# Patient Record
Sex: Female | Born: 2000
Health system: Southern US, Community
[De-identification: ages and names within clinical notes are randomized; demographics above are authoritative.]

## PROBLEM LIST (undated history)

## (undated) DIAGNOSIS — D573 Sickle-cell trait: Secondary | ICD-10-CM

## (undated) HISTORY — PX: TONSILLECTOMY: SUR1361

---
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
## 2018-01-13 NOTE — L&D Delivery Note (Signed)
Delivery Note Pt progressed to complete and pushed well.  FHR continued with variable decels with ctx but she made rapid progress.  At 11:08 AM a viable female was delivered via Vaginal, Spontaneous (Presentation: vtx; LOA ).  APGAR: 9, 9; weight pending.   Placenta status: spontaneous, intact.  Cord:  with the following complications: none.  Anesthesia:  Local Episiotomy: None Lacerations:  1st degree vaginal and right labial Suture Repair: 3.0 vicryl rapide Est. Blood Loss (mL):  400  Mom to postpartum.  Baby to Couplet care / Skin to Skin.  Blane Ohara Shaqueta Casady 10/17/2018, 11:29 AM

## 2018-02-05 ENCOUNTER — Emergency Department (HOSPITAL_COMMUNITY): Payer: 59

## 2018-02-05 ENCOUNTER — Emergency Department (HOSPITAL_COMMUNITY)
Admission: EM | Admit: 2018-02-05 | Discharge: 2018-02-05 | Disposition: A | Discharge status: Home / Self Care | Payer: 59 | Attending: Emergency Medicine | Admitting: Emergency Medicine

## 2018-02-05 ENCOUNTER — Encounter (HOSPITAL_COMMUNITY): Payer: Self-pay | Admitting: Emergency Medicine

## 2018-02-05 DIAGNOSIS — X509XXA Other and unspecified overexertion or strenuous movements or postures, initial encounter: Secondary | ICD-10-CM | POA: Insufficient documentation

## 2018-02-05 DIAGNOSIS — F17210 Nicotine dependence, cigarettes, uncomplicated: Secondary | ICD-10-CM | POA: Insufficient documentation

## 2018-02-05 DIAGNOSIS — K5900 Constipation, unspecified: Secondary | ICD-10-CM | POA: Insufficient documentation

## 2018-02-05 DIAGNOSIS — Y939 Activity, unspecified: Secondary | ICD-10-CM | POA: Insufficient documentation

## 2018-02-05 DIAGNOSIS — Y929 Unspecified place or not applicable: Secondary | ICD-10-CM | POA: Diagnosis not present

## 2018-02-05 DIAGNOSIS — S39012A Strain of muscle, fascia and tendon of lower back, initial encounter: Secondary | ICD-10-CM | POA: Diagnosis not present

## 2018-02-05 DIAGNOSIS — S3992XA Unspecified injury of lower back, initial encounter: Secondary | ICD-10-CM | POA: Diagnosis present

## 2018-02-05 DIAGNOSIS — Y999 Unspecified external cause status: Secondary | ICD-10-CM | POA: Diagnosis not present

## 2018-02-05 LAB — URINALYSIS, ROUTINE W REFLEX MICROSCOPIC
BILIRUBIN URINE: NEGATIVE
Bacteria, UA: NONE SEEN
Glucose, UA: NEGATIVE mg/dL
Hgb urine dipstick: NEGATIVE
KETONES UR: NEGATIVE mg/dL
NITRITE: NEGATIVE
Protein, ur: NEGATIVE mg/dL
Specific Gravity, Urine: 1.011 (ref 1.005–1.030)
pH: 6 (ref 5.0–8.0)

## 2018-02-05 LAB — PREGNANCY, URINE: Preg Test, Ur: NEGATIVE

## 2018-02-05 MED ORDER — METHOCARBAMOL 500 MG PO TABS: 500.0000 mg | ORAL_TABLET | Freq: Two times a day (BID) | ORAL | 0 refills | Status: DC

## 2018-02-05 MED ORDER — POLYETHYLENE GLYCOL 3350 17 G PO PACK: 17.0000 g | PACK | Freq: Every day | ORAL | 0 refills | Status: DC

## 2018-02-05 MED ORDER — NAPROXEN 500 MG PO TABS: 500.0000 mg | ORAL_TABLET | Freq: Two times a day (BID) | ORAL | 0 refills | Status: DC

## 2018-02-05 NOTE — Discharge Instructions (Addendum)
Please read attached information regarding increasing fiber in your diet to help with your constipation. Take MiraLAX to help with constipation. Take naproxen and Robaxin to help with your back pain. Return to ED for worsening back pain, losing control of your bowels or bladder, numbness in your legs, fever or pain with urination.

## 2018-02-05 NOTE — ED Triage Notes (Signed)
Pt reports lower back pains and constipation. Thinks her last BM was week ago. Denies urinary or n/v.

## 2018-02-05 NOTE — ED Provider Notes (Signed)
[facility]-EMERGENCY DEPT Provider Note   CSN: [id number] Arrival date & time: 02/05/18  1448     History   Chief Complaint Chief Complaint  Patient presents with  . Back Pain  . Constipation    HPI Julie Randall is a 18 y.o. female who presents to ED for multiple complaints. Her first complaint is 2-day history of lower back pain.  Describes the pain as aching, radiates throughout her entire lower back.  No improvement noted with over-the-counter medications such as Tylenol and ibuprofen.  No history of similar symptoms in the past.  Denies any heavy lifting, injuries or falls.  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  Denies any urinary symptoms, fever, prior back surgeries, numbness in arms or legs, loss of bowel or bladder function. Her next complaint is constipation.  States that she last had a bowel movement 1 week ago.  States that she usually has a bowel movement every 3 to 4 days.  She has not tried any medicine for this.  Denies any abdominal pain, vomiting, nausea, prior abdominal surgeries.  HPI  History reviewed. No pertinent past medical history.  There are no active problems to display for this patient.   Past Surgical History:  Procedure Laterality Date  . TONSILLECTOMY       OB History    Gravida  0   Para  0   Term  0   Preterm  0   AB  0   Living  0     SAB  0   TAB  0   Ectopic  0   Multiple  0   Live Births  0            Home Medications    Prior to Admission medications   Medication Sig Start Date End Date Taking? Authorizing Provider  acetaminophen (TYLENOL) 325 MG tablet Take 650 mg by mouth every 6 (six) hours as needed for moderate pain.   Yes [provider]  fluticasone (FLONASE) 50 MCG/ACT nasal spray Place 1 spray into both nostrils daily. Patient not taking: Reported on 08/31/2017 05/12/17   Caccavale, Sophia, PA-C  methocarbamol (ROBAXIN) 500 MG tablet Take 1 tablet (500 mg  total) by mouth 2 (two) times daily. 02/05/18   Hriday Stai, PA-C  naproxen (NAPROSYN) 500 MG tablet Take 1 tablet (500 mg total) by mouth 2 (two) times daily. 02/05/18   Vicenta Olds, PA-C  omeprazole (PRILOSEC) 20 MG capsule Take 1 capsule (20 mg total) by mouth daily. Patient not taking: Reported on 05/12/2017 02/11/17   Horton, Mayer Masker, MD  polyethylene glycol (MIRALAX / GLYCOLAX) packet Take 17 g by mouth daily. 02/05/18   Dietrich Pates, PA-C    Family History Family History  Problem Relation Age of Onset  . Breast cancer Mother   . Breast cancer Maternal Grandmother     Social History Social History   Tobacco Use  . Smoking status: Current Some Day Smoker    Types: Cigars  . Smokeless tobacco: Never Used  Substance Use Topics  . Alcohol use: No    Frequency: Never  . Drug use: No     Allergies   Patient has no known allergies.   Review of Systems Review of Systems  Constitutional: Negative for appetite change, chills and fever.  HENT: Negative for ear pain, rhinorrhea, sneezing and sore throat.   Eyes: Negative for photophobia and visual disturbance.  Respiratory: Negative for cough, chest tightness,  shortness of breath and wheezing.   Cardiovascular: Negative for chest pain and palpitations.  Gastrointestinal: Positive for constipation. Negative for abdominal pain, blood in stool, diarrhea, nausea and vomiting.  Genitourinary: Negative for dysuria, hematuria and urgency.  Musculoskeletal: Positive for back pain. Negative for myalgias.  Skin: Negative for rash.  Neurological: Negative for dizziness, weakness and light-headedness.     Physical Exam Updated Vital Signs BP 125/74 (BP Location: Left Arm)   Pulse 83   Temp 98.5 F (36.9 C) (Oral)   Resp 17   LMP 01/24/2018   SpO2 99%   Physical Exam Vitals signs and nursing note reviewed.  Constitutional:      General: She is not in acute distress.    Appearance: She is well-developed.  HENT:     Head:  Normocephalic and atraumatic.     Nose: Nose normal.  Eyes:     General: No scleral icterus.       Right eye: No discharge.        Left eye: No discharge.     Conjunctiva/sclera: Conjunctivae normal.  Neck:     Musculoskeletal: Normal range of motion and neck supple.  Cardiovascular:     Rate and Rhythm: Normal rate and regular rhythm.     Heart sounds: Normal heart sounds. No murmur. No friction rub. No gallop.   Pulmonary:     Effort: Pulmonary effort is normal. No respiratory distress.     Breath sounds: Normal breath sounds.  Abdominal:     General: Bowel sounds are normal. There is no distension.     Palpations: Abdomen is soft.     Tenderness: There is no abdominal tenderness. There is no guarding.  Musculoskeletal: Normal range of motion.     Lumbar back: She exhibits tenderness.       Back:     Comments: No step-off palpated. No visible bruising, edema or temperature change noted. No objective signs of numbness present. No saddle anesthesia. 2+ DP pulses bilaterally. Sensation intact to light touch. Strength 5/5 in bilateral lower extremities.  Skin:    General: Skin is warm and dry.     Findings: No rash.  Neurological:     Mental Status: She is alert.     Motor: No abnormal muscle tone.     Coordination: Coordination normal.      ED Treatments / Results  Labs (all labs ordered are listed, but only abnormal results are displayed) Labs Reviewed  URINALYSIS, ROUTINE W REFLEX MICROSCOPIC - Abnormal; Notable for the following components:      Result Value   Leukocytes, UA TRACE (*)    All other components within normal limits  PREGNANCY, URINE    EKG None  Radiology Dg Abdomen 1 View  Result Date: 02/05/2018 CLINICAL DATA:  Low back pain for 2 days.  Constipation for a week. EXAM: ABDOMEN - 1 VIEW COMPARISON:  None. FINDINGS: The fecal burden is normal. The bowel gas pattern is normal. The bones and soft tissues are normal. IMPRESSION: Negative. Electronically  Signed   By: Gerome Samavid  Williams III M.D   On: 02/05/2018 16:52    Procedures Procedures (including critical care time)  Medications Ordered in ED Medications - No data to display   Initial Impression / Assessment and Plan / ED Course  I have reviewed the triage vital signs and the nursing notes.  Pertinent labs & imaging results that were available during my care of the patient were reviewed by me and considered in my  medical decision making (see chart for details).     Patient denies any concerning symptoms suggestive of cauda equina requiring urgent imaging at this time such as loss of sensation in the lower extremities, lower extremity weakness, loss of bowel or bladder control, saddle anesthesia, urinary retention, fever/chills, IVDU. Exam demonstrated no  weakness on exam today. No preceding injury or trauma to suggest acute fracture. Doubt pelvic or urinary pathology for patient's acute back pain, as patient denies urinary symptoms, has no evidence of infection on UA, has no CVA tenderness, history/pain not consistent with nephrolithiasis, has no vaginal discharge, and is not pregnant as evidenced by negative POC urine pregnancy test. Doubt AAA as cause of patient's back pain as patient lacks major risk factors, had no abdominal TTP, and has symmetric and intact distal pulses.  She is declining enema or rectal exam to assess for tone or disimpaction.  X-rays negative for obstruction.  She prefers p.o. medications to help with her symptoms.  Patient given strict return precautions for any symptoms indicating worsening neurologic function in the lower extremities.  Patient is hemodynamically stable, in NAD, and able to ambulate in the ED. Evaluation does not show pathology that would require ongoing emergent intervention or inpatient treatment. I explained the diagnosis to the patient. Pain has been managed and has no complaints prior to discharge. Patient is comfortable with above plan and is  stable for discharge at this time. All questions were answered prior to disposition. Strict return precautions for returning to the ED were discussed. Encouraged follow up with PCP.    Portions of this note were generated with Scientist, clinical (histocompatibility and immunogenetics). Dictation errors may occur despite best attempts at proofreading.   Final Clinical Impressions(s) / ED Diagnoses   Final diagnoses:  Constipation, unspecified constipation type  Strain of lumbar region, initial encounter    ED Discharge Orders         Ordered    polyethylene glycol (MIRALAX / GLYCOLAX) packet  Daily     02/05/18 1700    naproxen (NAPROSYN) 500 MG tablet  2 times daily     02/05/18 1700    methocarbamol (ROBAXIN) 500 MG tablet  2 times daily     02/05/18 1700           Dietrich Pates, PA-C 02/05/18 1702    Loren Racer, MD 02/06/18 1645

## 2018-04-04 ENCOUNTER — Emergency Department (HOSPITAL_COMMUNITY)
Admission: EM | Admit: 2018-04-04 | Discharge: 2018-04-04 | Disposition: A | Discharge status: Home / Self Care | Payer: 59 | Attending: Emergency Medicine | Admitting: Emergency Medicine

## 2018-04-04 ENCOUNTER — Encounter (HOSPITAL_COMMUNITY): Payer: Self-pay

## 2018-04-04 ENCOUNTER — Other Ambulatory Visit: Payer: Self-pay

## 2018-04-04 DIAGNOSIS — Z3A09 9 weeks gestation of pregnancy: Secondary | ICD-10-CM | POA: Insufficient documentation

## 2018-04-04 DIAGNOSIS — Z79899 Other long term (current) drug therapy: Secondary | ICD-10-CM | POA: Diagnosis not present

## 2018-04-04 DIAGNOSIS — M545 Low back pain: Secondary | ICD-10-CM | POA: Insufficient documentation

## 2018-04-04 DIAGNOSIS — O9989 Other specified diseases and conditions complicating pregnancy, childbirth and the puerperium: Secondary | ICD-10-CM | POA: Diagnosis not present

## 2018-04-04 DIAGNOSIS — Z87891 Personal history of nicotine dependence: Secondary | ICD-10-CM | POA: Diagnosis not present

## 2018-04-04 DIAGNOSIS — O26891 Other specified pregnancy related conditions, first trimester: Secondary | ICD-10-CM

## 2018-04-04 MED ORDER — MAGNESIUM HYDROXIDE 400 MG/5ML PO SUSP: 30.0000 mL | Freq: Every day | ORAL | 0 refills | Status: DC | PRN

## 2018-04-04 NOTE — ED Notes (Signed)
Patient verbalizes understanding of discharge instructions . Opportunity for questions and answers were provided . Armband removed by staff ,Pt discharged from ED. W/C  offered at D/C  and Declined W/C at D/C and was escorted to lobby by RN.  

## 2018-04-04 NOTE — Discharge Instructions (Signed)
Your back pain should be treated with medicines such as ibuprofen or tylenol and this back pain should get better over the next 2 weeks.   Follow Up: Please follow up with your primary healthcare provider in 1-2 weeks for reassessment. if you do not have a primary care doctor use the resource guide provided to find one.  Low back pain is discomfort in the lower back that may be due to injuries to muscles and ligaments around the spine. Occasionally, it may be caused by a a problem to a part of the spine called a disc. The pain may last several days or a week;  However, most patients get completely well in 4 weeks.   1. Medications: In pregnancy you should not take anti-inflammatories. This includes Ibuprofen, aleve, motrin, naproxen, aspirin. You can take Tylenol for pain. Please take as directed on the bottle. Discuss Tylenol with your OB.   2. Treatment: rest, drink plenty of fluids, gentle stretching as discussed (see attached), alternate ice and heat (or stick with whichever feels best) 20 minutes on 20 minutes off. Maintaining your daily activities, including walking, is encourged, as it will help you get better faster than just staying in bed.  3. Call your [facility] doctor tomorrow to schedule follow up appointment in 2-5 days if needed.  Be aware that if you develop new symptoms, such as a fever, leg weakness, difficulty with or loss of control of your urine or bowels, abdominal pain, or more severe pain, you will need to seek medical attention immediately and  / or return to the Emergency department.

## 2018-04-04 NOTE — ED Triage Notes (Signed)
[redacted] weeks pregnant, EDC 11-01-18, G1P0.  Onset yesterday right lower back pain, swelling, constant.  Stretching made worse.  No vaginal bleeding, leaking fluid, abd pain, N/V/D, urinary.   At onset pt was sitting down.  No known injury.  Last BM 2-3 days ago, 2 hard balls.

## 2018-04-04 NOTE — ED Provider Notes (Signed)
[facility] EMERGENCY DEPARTMENT Provider Note   CSN: [id number] Arrival date & time: 04/04/18  1254    History   Chief Complaint Chief Complaint  Patient presents with  . Back Pain    HPI Julie Randall is a 18 y.o. female  that is [redacted] weeks pregnant with OB history G1P0 presenting to emergency department today with chief complaint of right lower back pain x1 day.  Patient states the pain has been constant.  She describes the pain as a dull ache that does not radiate.  Patient states she took Tylenol yesterday with minimal relief. She rates the pain 6 out of 10 in severity.  When attempting to stretch to relieve the pain she states it made the pain worse instead.  Patient denies any vaginal bleeding, pelvic pain, abdominal pain, nausea, vomiting, diarrhea, urinary symptoms, history of kidney stones.   Also denies urinary retention, saddle anesthesia, bowel and bladder incontinence, history of cancer, IVDU, night sweats, weight loss, recent fall or trauma.  History provided by pt.  History reviewed. No pertinent past medical history.  There are no active problems to display for this patient.   Past Surgical History:  Procedure Laterality Date  . TONSILLECTOMY       OB History    Gravida  1   Para  0   Term  0   Preterm  0   AB  0   Living  0     SAB  0   TAB  0   Ectopic  0   Multiple  0   Live Births  0            Home Medications    Prior to Admission medications   Medication Sig Start Date End Date Taking? Authorizing Provider  acetaminophen (TYLENOL) 325 MG tablet Take 650 mg by mouth every 6 (six) hours as needed for moderate pain.    [provider]  fluticasone (FLONASE) 50 MCG/ACT nasal spray Place 1 spray into both nostrils daily. Patient not taking: Reported on 08/31/2017 05/12/17   Caccavale, Sophia, PA-C  methocarbamol (ROBAXIN) 500 MG tablet Take 1 tablet (500 mg total) by mouth 2 (two) times daily. 02/05/18    Khatri, Hina, PA-C  naproxen (NAPROSYN) 500 MG tablet Take 1 tablet (500 mg total) by mouth 2 (two) times daily. 02/05/18   Khatri, Hina, PA-C  omeprazole (PRILOSEC) 20 MG capsule Take 1 capsule (20 mg total) by mouth daily. Patient not taking: Reported on 05/12/2017 02/11/17   Horton, Mayer Masker, MD  polyethylene glycol (MIRALAX / GLYCOLAX) packet Take 17 g by mouth daily. 02/05/18   Dietrich Pates, PA-C    Family History Family History  Problem Relation Age of Onset  . Breast cancer Mother   . Breast cancer Maternal Grandmother     Social History Social History   Tobacco Use  . Smoking status: Former Smoker    Types: Cigars    Last attempt to quit: 01/13/2018    Years since quitting: 0.2  . Smokeless tobacco: Never Used  Substance Use Topics  . Alcohol use: No    Frequency: Never  . Drug use: No     Allergies   Patient has no known allergies.   Review of Systems Review of Systems  Constitutional: Negative for chills and fever.  HENT: Negative for congestion, ear discharge, ear pain, sinus pressure, sinus pain and sore throat.   Eyes: Negative for pain and redness.  Respiratory: Negative for  cough and shortness of breath.   Cardiovascular: Negative for chest pain.  Gastrointestinal: Negative for abdominal pain, constipation, diarrhea, nausea and vomiting.  Genitourinary: Negative for difficulty urinating, dysuria, flank pain, hematuria, pelvic pain, urgency, vaginal bleeding and vaginal discharge.  Musculoskeletal: Positive for back pain. Negative for neck pain.  Skin: Negative for wound.  Neurological: Negative for weakness, numbness and headaches.     Physical Exam Updated Vital Signs BP 114/69   Pulse 93   Temp 97.6 F (36.4 C) (Oral)   Resp 16   Ht  (1.676 m)   Wt 54.9 kg   LMP 01/24/2018   SpO2 100%   BMI 19.53 kg/m   Physical Exam Vitals signs and nursing note reviewed.  Constitutional:      General: She is not in acute distress.    Appearance:  She is well-developed. She is not toxic-appearing.  HENT:     Head: Normocephalic and atraumatic.  Eyes:     General: No scleral icterus.       Right eye: No discharge.        Left eye: No discharge.     Conjunctiva/sclera: Conjunctivae normal.  Neck:     Musculoskeletal: Normal range of motion.  Cardiovascular:     Rate and Rhythm: Normal rate and regular rhythm.     Pulses: Normal pulses.     Heart sounds: Normal heart sounds.  Pulmonary:     Effort: Pulmonary effort is normal.     Breath sounds: Normal breath sounds.  Abdominal:     General: There is no distension.     Palpations: Abdomen is soft.     Tenderness: There is no abdominal tenderness.  Musculoskeletal: Normal range of motion.     Right lower leg: No edema.     Left lower leg: No edema.     Comments: Full range of motion of thoracic and lumbar spine with flexion, hyperextension, and lateral flexion. No midline tenderness or step offs. No tenderness to palpation of spinous processes or thoracic and lumbar spine. Tenderness to palpation of paraspinous muscles of right lumbar spine. Negative straight leg raise bilaterally.  Skin:    General: Skin is warm and dry.  Neurological:     Mental Status: She is oriented to person, place, and time.     Comments: Normal strength in lower extremities bilaterally including dorsiflexion and plantar flexion. Normal gait and balance   Psychiatric:        Behavior: Behavior normal.      ED Treatments / Results  Labs (all labs ordered are listed, but only abnormal results are displayed) Labs Reviewed - No data to display  EKG None  Radiology No results found.  Procedures Procedures (including critical care time)  Medications Ordered in ED Medications - No data to display   Initial Impression / Assessment and Plan / ED Course  I have reviewed the triage vital signs and the nursing notes.  Pertinent labs & imaging results that were available during my care of the  patient were reviewed by me and considered in my medical decision making (see chart for details).    Pt is afebrile, well appearing. Her prenatal care is with [facility]. She was seen recently and had an [place] that confirmed intrauterine pregnancy. Pt has next appointment scheduled on 04/28/2018. Her pain today is located in right lower back. DDX includes musculoskeletal pain, sciatic nerve pain, kidney stone, very unlikely cauda equina. On exam the pain is reproducible when  I push on right paraspinal muscles of lumbar spine. She is non tender over spinous processes. No neurological deficits and normal neuro exam.  Patient ambulates without pain.  No loss of bowel or bladder control.  No concern for cauda equina.  No fever, night sweats, weight loss, h/o cancer, IVDU.  RICE protocol and tylenol indicated and discussed with patient. Recommend pt follow up with OB.  Patient is hemodynamically stable, in NAD, and able to ambulate in the ED. Evaluation does not show pathology that would require ongoing emergent intervention or inpatient treatment. I explained the diagnosis to the patient. Patient is comfortable with above plan and is stable for discharge at this time. All questions were answered prior to disposition. Strict return precautions for returning to the ED were discussed.  This note was prepared with assistance of Conservation officer, historic buildings. Occasional wrong-word or sound-a-like substitutions may have occurred due to the inherent limitations of voice recognition software.  Final Clinical Impressions(s) / ED Diagnoses   Final diagnoses:  None    ED Discharge Orders    None       Sherene Sires, PA-C 04/04/18 1743    Margarita Grizzle, MD 04/06/18 [phone]

## 2018-04-04 NOTE — ED Triage Notes (Signed)
PT denies any vaginal dc or ABD cramping

## 2018-05-22 ENCOUNTER — Emergency Department (HOSPITAL_COMMUNITY): Payer: 59

## 2018-05-22 ENCOUNTER — Encounter (HOSPITAL_COMMUNITY): Payer: Self-pay | Admitting: *Deleted

## 2018-05-22 ENCOUNTER — Other Ambulatory Visit: Payer: Self-pay

## 2018-05-22 ENCOUNTER — Emergency Department (HOSPITAL_COMMUNITY)
Admission: EM | Admit: 2018-05-22 | Discharge: 2018-05-22 | Disposition: A | Discharge status: Home / Self Care | Payer: 59 | Attending: Emergency Medicine | Admitting: Emergency Medicine

## 2018-05-22 DIAGNOSIS — Z87891 Personal history of nicotine dependence: Secondary | ICD-10-CM | POA: Insufficient documentation

## 2018-05-22 DIAGNOSIS — B9689 Other specified bacterial agents as the cause of diseases classified elsewhere: Secondary | ICD-10-CM | POA: Diagnosis not present

## 2018-05-22 DIAGNOSIS — O23592 Infection of other part of genital tract in pregnancy, second trimester: Secondary | ICD-10-CM | POA: Insufficient documentation

## 2018-05-22 DIAGNOSIS — Z3A16 16 weeks gestation of pregnancy: Secondary | ICD-10-CM | POA: Insufficient documentation

## 2018-05-22 DIAGNOSIS — Z79899 Other long term (current) drug therapy: Secondary | ICD-10-CM | POA: Insufficient documentation

## 2018-05-22 DIAGNOSIS — O4692 Antepartum hemorrhage, unspecified, second trimester: Secondary | ICD-10-CM | POA: Diagnosis present

## 2018-05-22 HISTORY — DX: Sickle-cell trait: D57.3

## 2018-05-22 LAB — CBC WITH DIFFERENTIAL/PLATELET
Abs Immature Granulocytes: 0.06 10*3/uL (ref 0.00–0.07)
Basophils Absolute: 0 10*3/uL (ref 0.0–0.1)
Basophils Relative: 0 %
Eosinophils Absolute: 0.2 10*3/uL (ref 0.0–0.5)
Eosinophils Relative: 2 %
HCT: 37.7 % (ref 36.0–46.0)
Hemoglobin: 12.8 g/dL (ref 12.0–15.0)
Immature Granulocytes: 1 %
Lymphocytes Relative: 13 %
Lymphs Abs: 1.4 10*3/uL (ref 0.7–4.0)
MCH: 30.5 pg (ref 26.0–34.0)
MCHC: 34 g/dL (ref 30.0–36.0)
MCV: 89.8 fL (ref 80.0–100.0)
Monocytes Absolute: 0.8 10*3/uL (ref 0.1–1.0)
Monocytes Relative: 7 %
Neutro Abs: 8.1 10*3/uL — ABNORMAL HIGH (ref 1.7–7.7)
Neutrophils Relative %: 77 %
Platelets: 300 10*3/uL (ref 150–400)
RBC: 4.2 MIL/uL (ref 3.87–5.11)
RDW: 13.3 % (ref 11.5–15.5)
WBC: 10.5 10*3/uL (ref 4.0–10.5)
nRBC: 0 % (ref 0.0–0.2)

## 2018-05-22 LAB — COMPREHENSIVE METABOLIC PANEL
ALT: 18 U/L (ref 0–44)
AST: 18 U/L (ref 15–41)
Albumin: 4.1 g/dL (ref 3.5–5.0)
Alkaline Phosphatase: 64 U/L (ref 38–126)
Anion gap: 7 (ref 5–15)
BUN: 6 mg/dL (ref 6–20)
CO2: 24 mmol/L (ref 22–32)
Calcium: 9 mg/dL (ref 8.9–10.3)
Chloride: 104 mmol/L (ref 98–111)
Creatinine, Ser: 0.56 mg/dL (ref 0.44–1.00)
GFR calc Af Amer: >60 mL/min (ref >60)
GFR calc non Af Amer: >60 mL/min (ref >60)
Glucose, Bld: 82 mg/dL (ref 70–99)
Potassium: 3.7 mmol/L (ref 3.5–5.1)
Sodium: 135 mmol/L (ref 135–145)
Total Bilirubin: 0.6 mg/dL (ref 0.3–1.2)
Total Protein: 7.9 g/dL (ref 6.5–8.1)

## 2018-05-22 LAB — WET PREP, GENITAL
Sperm: NONE SEEN
Trich, Wet Prep: NONE SEEN
Yeast Wet Prep HPF POC: NONE SEEN

## 2018-05-22 LAB — URINALYSIS, ROUTINE W REFLEX MICROSCOPIC
Bilirubin Urine: NEGATIVE
Budding Yeast: NONE SEEN
Glucose, UA: NEGATIVE mg/dL
Hgb urine dipstick: NEGATIVE
Hyaline Casts, UA: NONE SEEN
Ketones, ur: NEGATIVE mg/dL
Mucus: NONE SEEN
Nitrite: NEGATIVE
Protein, ur: NEGATIVE mg/dL
Specific Gravity, Urine: 1.008 (ref 1.005–1.030)
pH: 8 (ref 5.0–8.0)

## 2018-05-22 LAB — ABO/RH: ABO/RH(D): O POS

## 2018-05-22 MED ORDER — METRONIDAZOLE 500 MG PO TABS: 500.0000 mg | ORAL_TABLET | Freq: Two times a day (BID) | ORAL | 0 refills | Status: DC

## 2018-05-22 NOTE — ED Triage Notes (Signed)
Pt states she is [redacted] weeks pregnant, light bleeding started yesterday, this morning passed a clot. Headache, abd cramping

## 2018-05-22 NOTE — ED Notes (Signed)
ED Provider at bedside. 

## 2018-05-22 NOTE — ED Provider Notes (Signed)
[facility]-EMERGENCY DEPT Provider Note   CSN: [id number] Arrival date & time: 05/22/18  1051    History   Chief Complaint Chief Complaint  Patient presents with  . Routine Prenatal Visit  . Vaginal Bleeding    HPI Julie Randall is a 18 y.o. female.     HPI Patient is [redacted] weeks pregnant.  She has had 2 days of mild vaginal bleeding.  States that she has some lower abdominal pain.  Both started after intercourse.  States the pain is dull.  States it does not feel like cramps.  States she has had some mild urinary problems.  States she is not feeling baby move but has not necessarily felt baby move yet.  Patient does not want to know the sex of the baby.  Does not know her blood type.  Sees Dr. Ellyn HackBovard has her obstetrician.  No other bleeding.  No blood in the stool. Past Medical History:  Diagnosis Date  . Sickle cell trait (HCC)     There are no active problems to display for this patient.   Past Surgical History:  Procedure Laterality Date  . TONSILLECTOMY       OB History    Gravida  1   Para  0   Term  0   Preterm  0   AB  0   Living  0     SAB  0   TAB  0   Ectopic  0   Multiple  0   Live Births  0            Home Medications    Prior to Admission medications   Medication Sig Start Date End Date Taking? Authorizing Provider  acetaminophen (TYLENOL) 325 MG tablet Take 650 mg by mouth every 6 (six) hours as needed for moderate pain or headache.    Yes [provider]  metoCLOPramide (REGLAN) 10 MG tablet Take 10 mg by mouth 3 (three) times daily as needed for nausea. 04/15/18  Yes [provider]  Prenatal Vit-Fe Fumarate-FA (PRENATAL PO) Take 1 tablet by mouth daily.   Yes [provider]  magnesium hydroxide (MILK OF MAGNESIA) 400 MG/5ML suspension Take 30 mLs by mouth daily as needed for mild constipation. Patient not taking: Reported on 05/22/2018 04/04/18   Albrizze, Yvonna AlanisKaitlyn E, PA-C   metroNIDAZOLE (FLAGYL) 500 MG tablet Take 1 tablet (500 mg total) by mouth 2 (two) times daily. 05/22/18   Benjiman CorePickering, Kalmen Lollar, MD  polyethylene glycol [facility](MIRALAX / GLYCOLAX) packet Take 17 g by mouth daily. Patient not taking: Reported on 05/22/2018 02/05/18   Dietrich PatesKhatri, Hina, PA-C    Family History Family History  Problem Relation Age of Onset  . Breast cancer Mother   . Breast cancer Maternal Grandmother     Social History Social History   Tobacco Use  . Smoking status: Former Smoker    Types: Cigars    Last attempt to quit: 01/13/2018    Years since quitting: 0.3  . Smokeless tobacco: Never Used  Substance Use Topics  . Alcohol use: No    Frequency: Never  . Drug use: No     Allergies   Patient has no known allergies.   Review of Systems Review of Systems  Constitutional: Negative for appetite change.  HENT: Negative for congestion.   Respiratory: Negative for shortness of breath.   Cardiovascular: Negative for chest pain.  Gastrointestinal: Positive for abdominal pain.  Genitourinary: Positive for pelvic pain and  vaginal bleeding.  Musculoskeletal: Negative for back pain.  Skin: Negative for rash.  Neurological: Negative for facial asymmetry, weakness and numbness.  Psychiatric/Behavioral: Negative for confusion.     Physical Exam Updated Vital Signs BP 121/89 (BP Location: Left Arm)   Pulse 82   Temp 97.7 F (36.5 C) (Oral)   Resp 20   Ht 5\' 6"  (1.676 m)   Wt 54 kg   LMP 01/24/2018   SpO2 100%   BMI 19.21 kg/m   Physical Exam Vitals signs and nursing note reviewed.  Constitutional:      Appearance: Normal appearance.  HENT:     Head: Normocephalic.  Eyes:     Pupils: Pupils are equal, round, and reactive to light.  Neck:     Musculoskeletal: Neck supple.  Cardiovascular:     Rate and Rhythm: Regular rhythm.  Pulmonary:     Effort: Pulmonary effort is normal.  Abdominal:     Comments: Suprapubic mass with some tenderness.  Musculoskeletal:      Right lower leg: No edema.     Left lower leg: No edema.  Skin:    General: Skin is warm.     Capillary Refill: Capillary refill takes less than 2 seconds.  Neurological:     Mental Status: She is alert. Mental status is at baseline.   Pelvic exam showed some mucousy discharge.  Slight oozing of the cervix.  Cervix is closed.  Gravid uterus.   ED Treatments / Results  Labs (all labs ordered are listed, but only abnormal results are displayed) Labs Reviewed  WET PREP, GENITAL - Abnormal; Notable for the following components:      Result Value   Clue Cells Wet Prep HPF POC PRESENT (*)    WBC, Wet Prep HPF POC MODERATE (*)    All other components within normal limits  CBC WITH DIFFERENTIAL/PLATELET - Abnormal; Notable for the following components:   Neutro Abs 8.1 (*)    All other components within normal limits  URINALYSIS, ROUTINE W REFLEX MICROSCOPIC - Abnormal; Notable for the following components:   APPearance HAZY (*)    Leukocytes,Ua SMALL (*)    Bacteria, UA RARE (*)    Crystals AMORPHOUS URATES/PHOSPHATES (*)    All other components within normal limits  COMPREHENSIVE METABOLIC PANEL  ABO/RH  GC/CHLAMYDIA PROBE AMP ([place]) NOT AT [facility]    EKG None  Radiology US Ob Limited > 14 Wks  Result Date: 05/22/2018 CLINICAL DATA:  18 year old pregnant female with vaginal bleeding and pelvic pain for 2 days. EXAM: LIMITED OBSTETRIC ULTRASOUND FINDINGS: Number of Fetuses: 1 Heart Rate:  154 bpm Movement: Yes Presentation: Breech Placental Location: Posterior Previa: No Amniotic Fluid (Subjective):  Within normal limits. BPD: 3.4 cm 16 w  4 d MATERNAL FINDINGS: Cervix:  Appears closed. Uterus/Adnexae: Ovaries not visualized. No adnexal mass or free fluid. IMPRESSION: Single living intrauterine gestation with estimated gestational age of [redacted] weeks 4 days by this ultrasound. No acute abnormality identified. This exam is performed on an emergent basis and does not comprehensively  evaluate fetal size, dating, or anatomy; follow-up complete OB US should be considered if further fetal assessment is warranted. Electronically Signed   By: Harmon Pier M.D.   On: 05/22/2018 13:26    Procedures Procedures (including critical care time)  Medications Ordered in ED Medications - No data to display   Initial Impression / Assessment and Plan / ED Course  I have reviewed the triage vital signs and the nursing  notes.  Pertinent labs & imaging results that were available during my care of the patient were reviewed by me and considered in my medical decision making (see chart for details).        Patient is pregnant.  Had some vaginal bleeding.  Lower abdominal pain.  Lab work and ultrasound reassuring.  Did have bacterial vaginosis on wet prep and will treat.  Discharge home with follow-up with her obstetrician.  Final Clinical Impressions(s) / ED Diagnoses   Final diagnoses:  Second trimester bleeding  Bacterial vaginosis in pregnancy    ED Discharge Orders         Ordered    metroNIDAZOLE (FLAGYL) 500 MG tablet  2 times daily     05/22/18 1342           Benjiman Core, MD 05/22/18 1348

## 2018-05-22 NOTE — ED Notes (Signed)
Ultrasound bedside.

## 2018-05-24 LAB — GC/CHLAMYDIA PROBE AMP (~~LOC~~) NOT AT ARMC
Chlamydia: NEGATIVE
Neisseria Gonorrhea: NEGATIVE

## 2018-10-06 LAB — OB RESULTS CONSOLE GBS: GBS: POSITIVE

## 2018-10-17 ENCOUNTER — Encounter (HOSPITAL_COMMUNITY): Payer: Self-pay

## 2018-10-17 ENCOUNTER — Inpatient Hospital Stay (HOSPITAL_COMMUNITY)
Admission: AD | Admit: 2018-10-17 | Discharge: 2018-10-19 | DRG: 807 | Disposition: A | Discharge status: Home / Self Care | Payer: 59 | Attending: Obstetrics and Gynecology | Admitting: Obstetrics and Gynecology

## 2018-10-17 ENCOUNTER — Other Ambulatory Visit: Payer: Self-pay

## 2018-10-17 DIAGNOSIS — Z3A37 37 weeks gestation of pregnancy: Secondary | ICD-10-CM

## 2018-10-17 DIAGNOSIS — O9902 Anemia complicating childbirth: Secondary | ICD-10-CM | POA: Diagnosis present

## 2018-10-17 DIAGNOSIS — O99824 Streptococcus B carrier state complicating childbirth: Secondary | ICD-10-CM | POA: Diagnosis present

## 2018-10-17 DIAGNOSIS — Z3689 Encounter for other specified antenatal screening: Secondary | ICD-10-CM

## 2018-10-17 DIAGNOSIS — Z87891 Personal history of nicotine dependence: Secondary | ICD-10-CM | POA: Diagnosis not present

## 2018-10-17 DIAGNOSIS — D573 Sickle-cell trait: Secondary | ICD-10-CM | POA: Diagnosis present

## 2018-10-17 DIAGNOSIS — Z20828 Contact with and (suspected) exposure to other viral communicable diseases: Secondary | ICD-10-CM | POA: Diagnosis present

## 2018-10-17 DIAGNOSIS — O4200 Premature rupture of membranes, onset of labor within 24 hours of rupture, unspecified weeks of gestation: Secondary | ICD-10-CM | POA: Diagnosis present

## 2018-10-17 DIAGNOSIS — O4202 Full-term premature rupture of membranes, onset of labor within 24 hours of rupture: Principal | ICD-10-CM | POA: Diagnosis present

## 2018-10-17 DIAGNOSIS — O4292 Full-term premature rupture of membranes, unspecified as to length of time between rupture and onset of labor: Secondary | ICD-10-CM | POA: Diagnosis present

## 2018-10-17 LAB — ABO/RH: ABO/RH(D): O POS

## 2018-10-17 LAB — COMPREHENSIVE METABOLIC PANEL
ALT: 12 U/L (ref 0–44)
AST: 18 U/L (ref 15–41)
Albumin: 2.8 g/dL — ABNORMAL LOW (ref 3.5–5.0)
Alkaline Phosphatase: 268 U/L — ABNORMAL HIGH (ref 38–126)
Anion gap: 10 (ref 5–15)
BUN: 5 mg/dL — ABNORMAL LOW (ref 6–20)
CO2: 21 mmol/L — ABNORMAL LOW (ref 22–32)
Calcium: 9.9 mg/dL (ref 8.9–10.3)
Chloride: 102 mmol/L (ref 98–111)
Creatinine, Ser: 0.64 mg/dL (ref 0.44–1.00)
GFR calc Af Amer: >60 mL/min (ref >60)
GFR calc non Af Amer: >60 mL/min (ref >60)
Glucose, Bld: 90 mg/dL (ref 70–99)
Potassium: 3.7 mmol/L (ref 3.5–5.1)
Sodium: 133 mmol/L — ABNORMAL LOW (ref 135–145)
Total Bilirubin: 0.5 mg/dL (ref 0.3–1.2)
Total Protein: 7 g/dL (ref 6.5–8.1)

## 2018-10-17 LAB — CBC
HCT: 32.5 % — ABNORMAL LOW (ref 36.0–46.0)
Hemoglobin: 11.3 g/dL — ABNORMAL LOW (ref 12.0–15.0)
MCH: 27.4 pg (ref 26.0–34.0)
MCHC: 34.8 g/dL (ref 30.0–36.0)
MCV: 78.9 fL — ABNORMAL LOW (ref 80.0–100.0)
Platelets: 335 10*3/uL (ref 150–400)
RBC: 4.12 MIL/uL (ref 3.87–5.11)
RDW: 14 % (ref 11.5–15.5)
WBC: 14.3 10*3/uL — ABNORMAL HIGH (ref 4.0–10.5)
nRBC: 0.2 % (ref 0.0–0.2)

## 2018-10-17 LAB — SARS CORONAVIRUS 2 BY RT PCR (HOSPITAL ORDER, PERFORMED IN ~~LOC~~ HOSPITAL LAB): SARS Coronavirus 2: NEGATIVE

## 2018-10-17 LAB — RPR: RPR Ser Ql: NONREACTIVE

## 2018-10-17 LAB — POCT FERN TEST: POCT Fern Test: POSITIVE

## 2018-10-17 MED ORDER — ONDANSETRON HCL 4 MG/2ML IJ SOLN: 4.0000 mg | INTRAMUSCULAR | Status: DC | PRN

## 2018-10-17 MED ORDER — LACTATED RINGERS IV SOLN
INTRAVENOUS | Status: DC
  Administered 2018-10-17: 06:00:00 via INTRAVENOUS

## 2018-10-17 MED ORDER — MEASLES, MUMPS & RUBELLA VAC IJ SOLR: 0.5000 mL | Freq: Once | INTRAMUSCULAR | Status: DC

## 2018-10-17 MED ORDER — WITCH HAZEL-GLYCERIN EX PADS: 1.0000 "application " | MEDICATED_PAD | CUTANEOUS | Status: DC | PRN

## 2018-10-17 MED ORDER — ONDANSETRON HCL 4 MG PO TABS: 4.0000 mg | ORAL_TABLET | ORAL | Status: DC | PRN

## 2018-10-17 MED ORDER — TERBUTALINE SULFATE 1 MG/ML IJ SOLN: 0.2500 mg | Freq: Once | INTRAMUSCULAR | Status: DC | PRN

## 2018-10-17 MED ORDER — METHYLERGONOVINE MALEATE 0.2 MG PO TABS: 0.2000 mg | ORAL_TABLET | ORAL | Status: DC | PRN

## 2018-10-17 MED ORDER — DIPHENHYDRAMINE HCL 50 MG/ML IJ SOLN: 12.5000 mg | INTRAMUSCULAR | Status: DC | PRN

## 2018-10-17 MED ORDER — ONDANSETRON HCL 4 MG/2ML IJ SOLN: 4.0000 mg | Freq: Four times a day (QID) | INTRAMUSCULAR | Status: DC | PRN

## 2018-10-17 MED ORDER — TETANUS-DIPHTH-ACELL PERTUSSIS 5-2.5-18.5 LF-MCG/0.5 IM SUSP: 0.5000 mL | Freq: Once | INTRAMUSCULAR | Status: DC

## 2018-10-17 MED ORDER — BUTORPHANOL TARTRATE 1 MG/ML IJ SOLN
1.0000 mg | INTRAMUSCULAR | Status: DC | PRN
  Administered 2018-10-17 (×4): 1 mg via INTRAVENOUS
  Filled 2018-10-17 (×4): qty 1

## 2018-10-17 MED ORDER — OXYCODONE-ACETAMINOPHEN 5-325 MG PO TABS: 2.0000 | ORAL_TABLET | ORAL | Status: DC | PRN

## 2018-10-17 MED ORDER — SODIUM CHLORIDE 0.9 % IV SOLN
5.0000 10*6.[IU] | Freq: Once | INTRAVENOUS | Status: AC
  Administered 2018-10-17: 5 10*6.[IU] via INTRAVENOUS
  Filled 2018-10-17: qty 5

## 2018-10-17 MED ORDER — IBUPROFEN 600 MG PO TABS
600.0000 mg | ORAL_TABLET | Freq: Four times a day (QID) | ORAL | Status: DC
  Administered 2018-10-17 – 2018-10-19 (×7): 600 mg via ORAL
  Filled 2018-10-17 (×7): qty 1

## 2018-10-17 MED ORDER — DIBUCAINE (PERIANAL) 1 % EX OINT: 1.0000 "application " | TOPICAL_OINTMENT | CUTANEOUS | Status: DC | PRN

## 2018-10-17 MED ORDER — OXYCODONE HCL 5 MG PO TABS: 5.0000 mg | ORAL_TABLET | ORAL | Status: DC | PRN

## 2018-10-17 MED ORDER — LACTATED RINGERS IV SOLN: 500.0000 mL | INTRAVENOUS | Status: DC | PRN

## 2018-10-17 MED ORDER — LACTATED RINGERS IV SOLN: 500.0000 mL | Freq: Once | INTRAVENOUS | Status: DC

## 2018-10-17 MED ORDER — SOD CITRATE-CITRIC ACID 500-334 MG/5ML PO SOLN
30.0000 mL | ORAL | Status: DC | PRN
  Administered 2018-10-17: 06:00:00 30 mL via ORAL
  Filled 2018-10-17: qty 30

## 2018-10-17 MED ORDER — FENTANYL-BUPIVACAINE-NACL 0.5-0.125-0.9 MG/250ML-% EP SOLN: 12.0000 mL/h | EPIDURAL | Status: DC | PRN

## 2018-10-17 MED ORDER — OXYCODONE-ACETAMINOPHEN 5-325 MG PO TABS: 1.0000 | ORAL_TABLET | ORAL | Status: DC | PRN

## 2018-10-17 MED ORDER — DIPHENHYDRAMINE HCL 25 MG PO CAPS: 25.0000 mg | ORAL_CAPSULE | Freq: Four times a day (QID) | ORAL | Status: DC | PRN

## 2018-10-17 MED ORDER — METHYLERGONOVINE MALEATE 0.2 MG/ML IJ SOLN: 0.2000 mg | INTRAMUSCULAR | Status: DC | PRN

## 2018-10-17 MED ORDER — LACTATED RINGERS AMNIOINFUSION
INTRAVENOUS | Status: DC
  Administered 2018-10-17: 11:00:00 via INTRAUTERINE

## 2018-10-17 MED ORDER — SENNOSIDES-DOCUSATE SODIUM 8.6-50 MG PO TABS
2.0000 | ORAL_TABLET | ORAL | Status: DC
  Administered 2018-10-17 – 2018-10-18 (×2): 2 via ORAL
  Filled 2018-10-17 (×2): qty 2

## 2018-10-17 MED ORDER — PHENYLEPHRINE 40 MCG/ML (10ML) SYRINGE FOR IV PUSH (FOR BLOOD PRESSURE SUPPORT): 80.0000 ug | PREFILLED_SYRINGE | INTRAVENOUS | Status: DC | PRN

## 2018-10-17 MED ORDER — OXYTOCIN 40 UNITS IN NORMAL SALINE INFUSION - SIMPLE MED
1.0000 m[IU]/min | INTRAVENOUS | Status: DC
  Administered 2018-10-17: 04:00:00 2 m[IU]/min via INTRAVENOUS
  Filled 2018-10-17 (×2): qty 1000

## 2018-10-17 MED ORDER — EPHEDRINE 5 MG/ML INJ: 10.0000 mg | INTRAVENOUS | Status: DC | PRN

## 2018-10-17 MED ORDER — BENZOCAINE-MENTHOL 20-0.5 % EX AERO
1.0000 "application " | INHALATION_SPRAY | CUTANEOUS | Status: DC | PRN
  Administered 2018-10-17: 1 via TOPICAL
  Filled 2018-10-17: qty 56

## 2018-10-17 MED ORDER — SIMETHICONE 80 MG PO CHEW: 80.0000 mg | CHEWABLE_TABLET | ORAL | Status: DC | PRN

## 2018-10-17 MED ORDER — COCONUT OIL OIL: 1.0000 "application " | TOPICAL_OIL | Status: DC | PRN

## 2018-10-17 MED ORDER — PRENATAL MULTIVITAMIN CH
1.0000 | ORAL_TABLET | Freq: Every day | ORAL | Status: DC
  Administered 2018-10-18: 1 via ORAL
  Filled 2018-10-17: qty 1

## 2018-10-17 MED ORDER — PENICILLIN G 3 MILLION UNITS IVPB - SIMPLE MED
3.0000 10*6.[IU] | INTRAVENOUS | Status: DC
  Administered 2018-10-17 (×2): 3 10*6.[IU] via INTRAVENOUS
  Filled 2018-10-17 (×2): qty 100

## 2018-10-17 MED ORDER — ACETAMINOPHEN 325 MG PO TABS: 650.0000 mg | ORAL_TABLET | ORAL | Status: DC | PRN

## 2018-10-17 MED ORDER — ZOLPIDEM TARTRATE 5 MG PO TABS: 5.0000 mg | ORAL_TABLET | Freq: Every evening | ORAL | Status: DC | PRN

## 2018-10-17 MED ORDER — OXYTOCIN 40 UNITS IN NORMAL SALINE INFUSION - SIMPLE MED
2.5000 [IU]/h | INTRAVENOUS | Status: DC
  Administered 2018-10-17: 12:00:00 2.5 [IU]/h via INTRAVENOUS

## 2018-10-17 MED ORDER — OXYCODONE HCL 5 MG PO TABS: 10.0000 mg | ORAL_TABLET | ORAL | Status: DC | PRN

## 2018-10-17 MED ORDER — LIDOCAINE HCL (PF) 1 % IJ SOLN
30.0000 mL | INTRAMUSCULAR | Status: AC | PRN
  Administered 2018-10-17: 30 mL via SUBCUTANEOUS
  Filled 2018-10-17: qty 30

## 2018-10-17 MED ORDER — MAGNESIUM HYDROXIDE 400 MG/5ML PO SUSP: 30.0000 mL | ORAL | Status: DC | PRN

## 2018-10-17 MED ORDER — OXYTOCIN BOLUS FROM INFUSION
500.0000 mL | Freq: Once | INTRAVENOUS | Status: AC
  Administered 2018-10-17: 500 mL via INTRAVENOUS

## 2018-10-17 NOTE — Lactation Note (Addendum)
This note was copied from a baby's chart. Lactation Consultation Note Baby 69 hrs old. Alert. Mom stated she just fed baby. Mom stated feeding well.  Mom stated she has been leaking for a few weeks. Mom stated when the leaking was bad, she would pump. Mom has 4 oz. Stored up in the freezer at home. Praised mom. Mom had hand pump w/42ml colostrum that she pumped. Praised mom. Milk storage discussed. Gave mom bottles and slow flow nipples. Pace feeding discussed. Gave mom LPI information sheet and reviewed d/t baby less than 6 lbs. Discussed supplementing if needed w/her colostrum. Mom plans on breast and bottle/w/BM. Mom shown how to use DEBP & how to disassemble, clean, & reassemble parts. Mom knows to pump q3h for 15-20 min. Mom encouraged to feed baby 8-12 times/24 hours and with feeding cues.  Hand expression demonstrated. Mom has everted nipples.  Lt. Breast softer than Rt. Breast. Mom BF on Lt. Breast. Discussed w/mom assessing for transfer after feeding and occassionally breast massage while feeding. Encouraged to call for latch. Newborn behavior, STS, I&O, supply and demand discussed. Lactation brochure given.  Patient Name: Julie Randall Today's Date: 10/17/2018 Reason for consult: Initial assessment;Early term 37-38.6wks;Primapara   Maternal Data Has patient been taught Hand Expression?: Yes Does the patient have breastfeeding experience prior to this delivery?: No  Feeding Feeding Type: Breast Fed  LATCH Score       Type of Nipple: Everted at rest and after stimulation  Comfort (Breast/Nipple): Soft / non-tender        Interventions Interventions: Breast feeding basics reviewed;DEBP;Breast massage  Lactation Tools Discussed/Used Tools: Pump Breast pump type: Double-Electric Breast Pump WIC Program: Yes Pump Review: Setup, frequency, and cleaning;Milk Storage Initiated by:: Allayne Stack RN IBCLC Date initiated:: 10/17/18   Consult Status Consult  Status: Follow-up Date: 10/18/18 Follow-up type: In-patient    Theodoro Kalata 10/17/2018, 11:33 PM

## 2018-10-17 NOTE — MAU Provider Note (Signed)
Pt informed that the ultrasound is considered a limited OB ultrasound and is not intended to be a complete ultrasound exam.  Patient also informed that the ultrasound is not being completed with the intent of assessing for fetal or placental anomalies or any pelvic abnormalities.  Explained that the purpose of today's ultrasound is to assess for  presentation.  Patient acknowledges the purpose of the exam and the limitations of the study.    Fetal position is confirmed as vertex by bedside US.

## 2018-10-17 NOTE — MAU Note (Signed)
Pt reports water broke at 2330, clear fluid. Denies contractions or vaginal bleeding. Reports good fetal movement. Cervix 2.5cm last week

## 2018-10-17 NOTE — Progress Notes (Signed)
Feeling ctx, has received Stadol Afeb, VSS, BP 109-142/51-93 FHT- 120s, mod variability with Stadol effect, rare small variable decel VE-6 cm per RN Will continue pitocin and PCN, monitor progress, anticipate SVD

## 2018-10-17 NOTE — H&P (Signed)
Julie Randall is a 18 y.o. female, G1P0, EGA 37+ weeks with EDC 10-19 presenting for leaking fluid since 2330.  On eval in MAU, grossly ruptured.  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  OB History    Gravida  1   Para  0   Term  0   Preterm  0   AB  0   Living  0     SAB  0   TAB  0   Ectopic  0   Multiple  0   Live Births  0          Past Medical History:  Diagnosis Date  . Sickle cell trait [facility])    Past Surgical History:  Procedure Laterality Date  . TONSILLECTOMY     Family History: family history includes Breast cancer in her maternal grandmother and mother. Social History:  reports that she quit smoking about 9 months ago. Her smoking use included cigars. She has never used smokeless tobacco. She reports that she does not drink alcohol or use drugs.     Maternal Diabetes: No Genetic Screening: Normal Maternal Ultrasounds/Referrals: Normal Fetal Ultrasounds or other Referrals:  None Maternal Substance Abuse:  No Significant Maternal Medications:  None Significant Maternal Lab Results:  Group B Strep positive Other Comments:  Hgb AS, slient carrier SMA-FOB neg  Review of Systems  Respiratory: Negative.   Cardiovascular: Negative.    Maternal Medical History:  Reason for admission: Rupture of membranes.   Contractions: Perceived severity is mild.    Fetal activity: Perceived fetal activity is normal.    Prenatal complications: no prenatal complications Prenatal Complications - Diabetes: none.    Dilation: 3 Effacement (%): 50 Station: -2 Exam by:: Cassie Freer, RN  Blood pressure (!) 151/88, pulse 80, temperature 98.7 F (37.1 C), temperature source Oral, resp. rate 18, height 5\' 6"  (1.676 m), weight 69.9 kg, last menstrual period 01/24/2018, SpO2 100 %. Maternal Exam:  Uterine Assessment: Contraction strength is mild.  Contraction frequency is irregular.   Abdomen: Patient reports no abdominal tenderness. Estimated  fetal weight is 6.5 lbs.   Fetal presentation: vertex  Introitus: Normal vulva. Normal vagina.  Ferning test: positive.  Amniotic fluid character: clear.  Pelvis: adequate for delivery.      Fetal Exam Fetal Monitor Review: Mode: ultrasound.   Baseline rate: 120-130.  Variability: moderate (6-25 bpm).   Pattern: accelerations present and no decelerations.    Fetal State Assessment: Category I - tracings are normal.     Physical Exam  Vitals reviewed. Constitutional: She appears well-developed and well-nourished.  Cardiovascular: Normal rate and regular rhythm.  Respiratory: Effort normal. No respiratory distress.  GI: Soft.  Genitourinary:    Vulva normal.     Prenatal labs: ABO, Rh: --/--/O POS, O POS Performed at [facility], [place] [address] [place], [place] [address]  [id number]/04 0111) Antibody: NEG (10/04 0111) Rubella:  immune RPR:   NR HBsAg:   neg HIV:   NR GBS: Positive/-- (09/23 0000)   Assessment/Plan: IUP at 37+ weeks with PROM, +GBS.  Will admit, I have encouraged her to accept pitocin for labor augmentation since she has few ctx and GBS+, she is considering.  PCN for +GBS.   Blane Ohara Yotam Rhine 10/17/2018, 2:52 AM

## 2018-10-17 NOTE — Progress Notes (Signed)
FHT with increasing variable decels with most ctx, + scalp stim VE-8-9/90/0, IUPC inserted Will try amnioinfusion for variable decels, making rapid progress, anticipate SVD

## 2018-10-18 LAB — TYPE AND SCREEN
ABO/RH(D): O POS
Antibody Screen: NEGATIVE

## 2018-10-18 NOTE — Progress Notes (Addendum)
Post Partum Day 1 Subjective: no complaints, up ad lib, voiding, tolerating PO and nl lochia, pain controlled  Objective: Blood pressure 113/82, pulse 68, temperature 98 F (36.7 C), temperature source Oral, resp. rate 16, height 5\' 6"  (1.676 m), weight 69.9 kg, last menstrual period 01/24/2018, SpO2 100 %, unknown if currently breastfeeding.  Physical Exam:  General: alert and no distress Lochia: appropriate Uterine Fundus: firm   Recent Labs    10/17/18 0111  HGB 11.3*  HCT 32.5*    Assessment/Plan: Plan for discharge tomorrow, Breastfeeding and Lactation consult.  Routine PP care.  Pt desires d/c today.  D/W pt to d/w peds, would have to be 24 hr.  If able to be d/c would d/c with Motrin and PNV.  F/U 6 weeks     LOS: 1 day   Julie Randall 10/18/2018, 8:34 AM

## 2018-10-18 NOTE — Lactation Note (Addendum)
This note was copied from a baby's chart. Lactation Consultation Note  Patient Name: Julie Randall Today's Date: 10/18/2018 Reason for consult: Follow-up assessment;Early term 37-38.6wks;1st time breastfeeding;Primapara;Infant < 6lbs  P1 mother whose infant is now 31 hours old.  This is an ETI at 37+6 weeks weighing < 6 lbs.  Mother has been breast feeding without difficulty.  She informed me that baby awakens and latches well.  Mother has a good supply of colostrum and is using the manual pump to obtain colostrum to feed back to baby.  She also has some frozen milk at home that she was able to pump prior to delivery.  We discussed how to safely thaw and warm this milk.  Breast milk storage chart in her Mother and New Baby Booklet reviewed.    Encouraged mother to feed often and with cues tonight.  Asked her to awaken baby at the third hour if she does not self awaken.  Mother happy to report that she knows her baby will wake easily.  Discussed cluster feeding.  Reminded her to feed STS and keep her actively feeding at the breast tonight.  Mother verbalized understanding.  She is pleased that her baby has been feeding well.  Encouraged to call her RN/LC for latch assistance or if she cannot awaken baby for feedings.  Mother verbalized understanding.  Mother has a DEBP for home use.   Maternal Data Formula Feeding for Exclusion: No Has patient been taught Hand Expression?: Yes Does the patient have breastfeeding experience prior to this delivery?: No  Feeding Feeding Type: Breast Fed  LATCH Score Latch: Grasps breast easily, tongue down, lips flanged, rhythmical sucking.  Audible Swallowing: A few with stimulation  Type of Nipple: Everted at rest and after stimulation  Comfort (Breast/Nipple): Soft / non-tender  Hold (Positioning): No assistance needed to correctly position infant at breast.  LATCH Score: 9  Interventions    Lactation Tools Discussed/Used     Consult  Status Consult Status: Follow-up Date: 10/19/18 Follow-up type: In-patient    Little Ishikawa 10/18/2018, 6:11 PM

## 2018-10-19 MED ORDER — IBUPROFEN 600 MG PO TABS: 600.0000 mg | ORAL_TABLET | Freq: Four times a day (QID) | ORAL | 1 refills | Status: DC | PRN

## 2018-10-19 NOTE — Discharge Summary (Signed)
OB Discharge Summary     Patient Name: Julie Randall DOB: 11-05-2000 MRN: [id number]  Date of admission: 10/17/2018 Delivering MD: Willis Modena, TODD   Date of discharge: 10/19/2018  Admitting diagnosis: 72 wks water broke Intrauterine pregnancy: [redacted]w[redacted]d     Secondary diagnosis:  Active Problems:   PROM with onset of labor within 24 hours of rupture   SVD (spontaneous vaginal delivery)  Additional problems: none     Discharge diagnosis: Term Pregnancy Delivered                                                                                                Post partum procedures:none  Augmentation: Pitocin  Complications: None  Hospital course:  Onset of Labor With Vaginal Delivery     18 y.o. yo G1P1001 at [redacted]w[redacted]d was admitted in Latent Labor on 10/17/2018. Patient had an uncomplicated labor course as follows:  Membrane Rupture Time/Date: 11:30 PM ,10/16/2018   Intrapartum Procedures: Episiotomy: None [1]                                         Lacerations:  1st degree [2];Perineal [11];Labial [10]  Patient had a delivery of a Viable infant. 10/17/2018  Information for the patient's newborn:  Shalene, [place] Girl [place] [[id number]]  Delivery Method: Vag-Spont     Pateint had an uncomplicated postpartum course.  She is ambulating, tolerating a regular diet, passing flatus, and urinating well. Patient is discharged home in stable condition on 10/19/18.   Physical exam  Vitals:   10/18/18 0547 10/18/18 1415 10/18/18 2037 10/19/18 0900  BP: 113/82 109/65 116/62 108/63  Pulse: 68 77 82 75  Resp: 16 16 16 16   Temp: 98 F (36.7 C) 98.8 F (37.1 C) (!) 97.4 F (36.3 C) 97.9 F (36.6 C)  TempSrc: Oral Oral  Oral  SpO2: 100%  100%   Weight:      Height:       General: alert, cooperative and no distress Lochia: appropriate Uterine Fundus: firm Incision: N/A DVT Evaluation: No evidence of DVT seen on physical exam. Labs: Lab Results  Component Value Date   WBC 14.3 (H)  10/17/2018   HGB 11.3 (L) 10/17/2018   HCT 32.5 (L) 10/17/2018   MCV 78.9 (L) 10/17/2018   PLT 335 10/17/2018   CMP Latest Ref Rng & Units 10/17/2018  Glucose 70 - 99 mg/dL 90  BUN 6 - 20 mg/dL 5(L)  Creatinine 0.44 - 1.00 mg/dL 0.64  Sodium 135 - 145 mmol/L 133(L)  Potassium 3.5 - 5.1 mmol/L 3.7  Chloride 98 - 111 mmol/L 102  CO2 22 - 32 mmol/L 21(L)  Calcium 8.9 - 10.3 mg/dL 9.9  Total Protein 6.5 - 8.1 g/dL 7.0  Total Bilirubin 0.3 - 1.2 mg/dL 0.5  Alkaline Phos 38 - 126 U/L 268(H)  AST 15 - 41 U/L 18  ALT 0 - 44 U/L 12    Discharge instruction: per After Visit Summary and "Baby and Me Booklet".  After visit meds:  Allergies as of 10/19/2018   No Known Allergies     Medication List    TAKE these medications   ibuprofen 600 MG tablet Commonly known as: ADVIL Take 1 tablet (600 mg total) by mouth every 6 (six) hours as needed for cramping.   PRENATAL PO Take 1 tablet by mouth daily.       Diet: routine diet  Activity: Advance as tolerated. Pelvic rest for 6 weeks.   Outpatient follow up:6 weeks Follow up Appt:No future appointments. Follow up Visit:No follow-ups on file.  Postpartum contraception: Not Discussed  Newborn Data: Live born female  Birth Weight: 5 lb 15.8 oz (2715 g) APGAR: 8, 9  Newborn Delivery   Birth date/time: 10/17/2018 11:08:00 Delivery type: Vaginal, Spontaneous      Baby Feeding: Breast Disposition:home with mother   10/19/2018 Cathrine Muster, DO

## 2018-10-19 NOTE — Progress Notes (Signed)
Patient ID: Julie Randall, female   DOB: 18-Sep-2000, 18 y.o.   MRN: [id number] Pt doing well with no complaints. Ambulating, tolerating diet well and reports mild lochia only. She denies HA/CP/SOB. Bonding well with baby VSS GEN - NAD ABD - FF, 3cm below umbilicus EXT - no homans  A/P: PPD#2 s/p svd - stable         Discharge instructions reviewed

## 2018-10-19 NOTE — Discharge Instructions (Signed)
All office with any concerns [phone]

## 2018-11-01 ENCOUNTER — Inpatient Hospital Stay (HOSPITAL_COMMUNITY): Admission: AD | Admit: 2018-11-01 | Payer: Medicaid Other | Source: Home / Self Care

## 2018-11-06 ENCOUNTER — Encounter (HOSPITAL_COMMUNITY): Payer: Self-pay

## 2018-11-06 ENCOUNTER — Other Ambulatory Visit: Payer: Self-pay

## 2018-11-06 ENCOUNTER — Inpatient Hospital Stay (EMERGENCY_DEPARTMENT_HOSPITAL)
Admission: AD | Admit: 2018-11-06 | Discharge: 2018-11-06 | Disposition: A | Discharge status: Home / Self Care | Payer: 59 | Source: Home / Self Care | Attending: Obstetrics and Gynecology | Admitting: Obstetrics and Gynecology

## 2018-11-06 ENCOUNTER — Emergency Department (HOSPITAL_BASED_OUTPATIENT_CLINIC_OR_DEPARTMENT_OTHER)
Admission: EM | Admit: 2018-11-06 | Discharge: 2018-11-06 | Disposition: A | Discharge status: Home / Self Care | Payer: 59 | Attending: Emergency Medicine | Admitting: Emergency Medicine

## 2018-11-06 ENCOUNTER — Encounter (HOSPITAL_BASED_OUTPATIENT_CLINIC_OR_DEPARTMENT_OTHER): Payer: Self-pay | Admitting: Emergency Medicine

## 2018-11-06 DIAGNOSIS — O9089 Other complications of the puerperium, not elsewhere classified: Secondary | ICD-10-CM | POA: Insufficient documentation

## 2018-11-06 DIAGNOSIS — R519 Headache, unspecified: Secondary | ICD-10-CM | POA: Insufficient documentation

## 2018-11-06 DIAGNOSIS — N390 Urinary tract infection, site not specified: Secondary | ICD-10-CM | POA: Diagnosis not present

## 2018-11-06 DIAGNOSIS — N3000 Acute cystitis without hematuria: Secondary | ICD-10-CM

## 2018-11-06 DIAGNOSIS — Z79899 Other long term (current) drug therapy: Secondary | ICD-10-CM | POA: Insufficient documentation

## 2018-11-06 DIAGNOSIS — N61 Mastitis without abscess: Secondary | ICD-10-CM | POA: Insufficient documentation

## 2018-11-06 DIAGNOSIS — Z87891 Personal history of nicotine dependence: Secondary | ICD-10-CM | POA: Insufficient documentation

## 2018-11-06 DIAGNOSIS — O864 Pyrexia of unknown origin following delivery: Secondary | ICD-10-CM | POA: Diagnosis not present

## 2018-11-06 DIAGNOSIS — E876 Hypokalemia: Secondary | ICD-10-CM | POA: Diagnosis not present

## 2018-11-06 DIAGNOSIS — Z20828 Contact with and (suspected) exposure to other viral communicable diseases: Secondary | ICD-10-CM | POA: Diagnosis not present

## 2018-11-06 DIAGNOSIS — R509 Fever, unspecified: Secondary | ICD-10-CM | POA: Insufficient documentation

## 2018-11-06 DIAGNOSIS — O9122 Nonpurulent mastitis associated with the puerperium: Secondary | ICD-10-CM | POA: Diagnosis not present

## 2018-11-06 DIAGNOSIS — Z3202 Encounter for pregnancy test, result negative: Secondary | ICD-10-CM | POA: Insufficient documentation

## 2018-11-06 LAB — COMPREHENSIVE METABOLIC PANEL
ALT: 12 U/L (ref 0–44)
AST: 14 U/L — ABNORMAL LOW (ref 15–41)
Albumin: 3.3 g/dL — ABNORMAL LOW (ref 3.5–5.0)
Alkaline Phosphatase: 128 U/L — ABNORMAL HIGH (ref 38–126)
Anion gap: 11 (ref 5–15)
BUN: <5 mg/dL — ABNORMAL LOW (ref 6–20)
CO2: 22 mmol/L (ref 22–32)
Calcium: 8.8 mg/dL — ABNORMAL LOW (ref 8.9–10.3)
Chloride: 106 mmol/L (ref 98–111)
Creatinine, Ser: 0.73 mg/dL (ref 0.44–1.00)
GFR calc Af Amer: >60 mL/min (ref >60)
GFR calc non Af Amer: >60 mL/min (ref >60)
Glucose, Bld: 90 mg/dL (ref 70–99)
Potassium: 3.2 mmol/L — ABNORMAL LOW (ref 3.5–5.1)
Sodium: 139 mmol/L (ref 135–145)
Total Bilirubin: 0.4 mg/dL (ref 0.3–1.2)
Total Protein: 7.1 g/dL (ref 6.5–8.1)

## 2018-11-06 LAB — BASIC METABOLIC PANEL
Anion gap: 9 (ref 5–15)
BUN: 5 mg/dL — ABNORMAL LOW (ref 6–20)
CO2: 22 mmol/L (ref 22–32)
Calcium: 8.3 mg/dL — ABNORMAL LOW (ref 8.9–10.3)
Chloride: 105 mmol/L (ref 98–111)
Creatinine, Ser: 0.6 mg/dL (ref 0.44–1.00)
GFR calc Af Amer: >60 mL/min (ref >60)
GFR calc non Af Amer: >60 mL/min (ref >60)
Glucose, Bld: 105 mg/dL — ABNORMAL HIGH (ref 70–99)
Potassium: 2.6 mmol/L — CL (ref 3.5–5.1)
Sodium: 136 mmol/L (ref 135–145)

## 2018-11-06 LAB — URINALYSIS, ROUTINE W REFLEX MICROSCOPIC
Bilirubin Urine: NEGATIVE
Glucose, UA: NEGATIVE mg/dL
Ketones, ur: NEGATIVE mg/dL
Nitrite: NEGATIVE
Protein, ur: NEGATIVE mg/dL
Specific Gravity, Urine: <1.005 — ABNORMAL LOW (ref 1.005–1.030)
pH: 7 (ref 5.0–8.0)

## 2018-11-06 LAB — CBC WITH DIFFERENTIAL/PLATELET
Abs Immature Granulocytes: 0.03 10*3/uL (ref 0.00–0.07)
Basophils Absolute: 0 10*3/uL (ref 0.0–0.1)
Basophils Relative: 0 %
Eosinophils Absolute: 0.1 10*3/uL (ref 0.0–0.5)
Eosinophils Relative: 1 %
HCT: 34.4 % — ABNORMAL LOW (ref 36.0–46.0)
Hemoglobin: 11.2 g/dL — ABNORMAL LOW (ref 12.0–15.0)
Immature Granulocytes: 0 %
Lymphocytes Relative: 8 %
Lymphs Abs: 1 10*3/uL (ref 0.7–4.0)
MCH: 25.3 pg — ABNORMAL LOW (ref 26.0–34.0)
MCHC: 32.6 g/dL (ref 30.0–36.0)
MCV: 77.8 fL — ABNORMAL LOW (ref 80.0–100.0)
Monocytes Absolute: 0.7 10*3/uL (ref 0.1–1.0)
Monocytes Relative: 6 %
Neutro Abs: 9.9 10*3/uL — ABNORMAL HIGH (ref 1.7–7.7)
Neutrophils Relative %: 85 %
Platelets: 325 10*3/uL (ref 150–400)
RBC: 4.42 MIL/uL (ref 3.87–5.11)
RDW: 16 % — ABNORMAL HIGH (ref 11.5–15.5)
WBC: 11.7 10*3/uL — ABNORMAL HIGH (ref 4.0–10.5)
nRBC: 0 % (ref 0.0–0.2)

## 2018-11-06 LAB — CBC
HCT: 36.6 % (ref 36.0–46.0)
Hemoglobin: 12.4 g/dL (ref 12.0–15.0)
MCH: 26.2 pg (ref 26.0–34.0)
MCHC: 33.9 g/dL (ref 30.0–36.0)
MCV: 77.2 fL — ABNORMAL LOW (ref 80.0–100.0)
Platelets: 298 10*3/uL (ref 150–400)
RBC: 4.74 MIL/uL (ref 3.87–5.11)
RDW: 16.3 % — ABNORMAL HIGH (ref 11.5–15.5)
WBC: 14 10*3/uL — ABNORMAL HIGH (ref 4.0–10.5)
nRBC: 0 % (ref 0.0–0.2)

## 2018-11-06 LAB — URINALYSIS, MICROSCOPIC (REFLEX)

## 2018-11-06 LAB — PROTEIN / CREATININE RATIO, URINE
Creatinine, Urine: 162.41 mg/dL
Protein Creatinine Ratio: 0.18 mg/mg{Cre} — ABNORMAL HIGH (ref 0.00–0.15)
Total Protein, Urine: 30 mg/dL

## 2018-11-06 LAB — SARS CORONAVIRUS 2 (TAT 6-24 HRS): SARS Coronavirus 2: NEGATIVE

## 2018-11-06 LAB — PREGNANCY, URINE: Preg Test, Ur: NEGATIVE

## 2018-11-06 MED ORDER — IBUPROFEN 600 MG PO TABS
600.0000 mg | ORAL_TABLET | Freq: Four times a day (QID) | ORAL | Status: DC | PRN
  Administered 2018-11-06: 16:00:00 600 mg via ORAL
  Filled 2018-11-06: qty 1

## 2018-11-06 MED ORDER — CEPHALEXIN 500 MG PO CAPS: 500.0000 mg | ORAL_CAPSULE | Freq: Two times a day (BID) | ORAL | 0 refills | Status: DC

## 2018-11-06 MED ORDER — POTASSIUM CHLORIDE 10 MEQ/100ML IV SOLN
10.0000 meq | INTRAVENOUS | Status: AC
  Administered 2018-11-06 (×2): 10 meq via INTRAVENOUS
  Filled 2018-11-06: qty 100

## 2018-11-06 MED ORDER — SODIUM CHLORIDE 0.9 % IV SOLN
1.0000 g | Freq: Once | INTRAVENOUS | Status: AC
  Administered 2018-11-06: 1 g via INTRAVENOUS
  Filled 2018-11-06: qty 10

## 2018-11-06 MED ORDER — TRAMADOL HCL 50 MG PO TABS: 50.0000 mg | ORAL_TABLET | Freq: Four times a day (QID) | ORAL | 0 refills | Status: DC | PRN

## 2018-11-06 MED ORDER — POTASSIUM CHLORIDE 20 MEQ/15ML (10%) PO SOLN
20.0000 meq | Freq: Once | ORAL | Status: AC
  Administered 2018-11-06: 20 meq via ORAL
  Filled 2018-11-06: qty 15

## 2018-11-06 MED ORDER — ACETAMINOPHEN 500 MG PO TABS
ORAL_TABLET | ORAL | Status: AC
  Filled 2018-11-06: qty 2

## 2018-11-06 MED ORDER — POTASSIUM CHLORIDE CRYS ER 20 MEQ PO TBCR: 40.0000 meq | EXTENDED_RELEASE_TABLET | Freq: Every day | ORAL | 0 refills | Status: DC

## 2018-11-06 MED ORDER — CEPHALEXIN 250 MG PO CAPS: 250.0000 mg | ORAL_CAPSULE | Freq: Four times a day (QID) | ORAL | 0 refills | Status: DC

## 2018-11-06 MED ORDER — ACETAMINOPHEN 500 MG PO TABS
1000.0000 mg | ORAL_TABLET | Freq: Once | ORAL | Status: AC
  Administered 2018-11-06: 01:00:00 1000 mg via ORAL

## 2018-11-06 MED ORDER — SODIUM CHLORIDE 0.9 % IV SOLN
INTRAVENOUS | Status: DC | PRN
  Administered 2018-11-06: 03:00:00 via INTRAVENOUS

## 2018-11-06 MED ORDER — POTASSIUM CHLORIDE CRYS ER 20 MEQ PO TBCR
40.0000 meq | EXTENDED_RELEASE_TABLET | Freq: Once | ORAL | Status: AC
  Administered 2018-11-06: 40 meq via ORAL
  Filled 2018-11-06: qty 2

## 2018-11-06 MED ORDER — OXYCODONE HCL 5 MG PO TABS
5.0000 mg | ORAL_TABLET | Freq: Once | ORAL | Status: AC
  Administered 2018-11-06: 5 mg via ORAL
  Filled 2018-11-06: qty 1

## 2018-11-06 NOTE — ED Notes (Signed)
Pt breast feeding. EKG to be done in 5 minutes. Pt denies chest pain.

## 2018-11-06 NOTE — ED Provider Notes (Signed)
[facility] Provider Note: Lowella DellJ. Lane Shuayb Schepers, MD, FACEP  CSN: [id number] MRN: [id number] ARRIVAL: 11/06/18 at 0054 ROOM: MH01/MH01   CHIEF COMPLAINT  Headache   HISTORY OF PRESENT ILLNESS  11/06/18 1:44 AM Julie Randall is a 18 y.o. female who recently had a baby by vaginal birth on 10/17/2018 after PROM.  She is here with a headache that began gradually yesterday.  She has had associated body aches in her buttocks and thighs along with a sensation of tingling in her legs.  She rates the pain in her legs as a 7 out of 10, somewhat worse with movement.  She was noted to have a temperature of 102.7 on arrival and was given acetaminophen with improvement in her fever and headache.  She denies neck pain, back pain, abdominal pain or dysuria but does feel the need to urinate.  She denies abnormal vaginal bleeding or discharge.    Past Medical History:  Diagnosis Date  . Sickle cell trait [facility](HCC)     Past Surgical History:  Procedure Laterality Date  . TONSILLECTOMY      Family History  Problem Relation Age of Onset  . Breast cancer Mother   . Breast cancer Maternal Grandmother     Social History   Tobacco Use  . Smoking status: Former Smoker    Types: Cigars    Quit date: 01/13/2018    Years since quitting: 0.8  . Smokeless tobacco: Never Used  Substance Use Topics  . Alcohol use: No    Frequency: Never  . Drug use: No    Prior to Admission medications   Medication Sig Start Date End Date Taking? Authorizing Provider  cephALEXin (KEFLEX) 500 MG capsule Take 1 capsule (500 mg total) by mouth 2 (two) times daily. 11/06/18   Lamis Behrmann, MD  potassium chloride SA (KLOR-CON) 20 MEQ tablet Take 2 tablets (40 mEq total) by mouth daily. 11/06/18   Bionca Mckey, MD  Prenatal Vit-Fe Fumarate-FA (PRENATAL PO) Take 1 tablet by mouth daily.    [provider]    Allergies Patient has no known allergies.   REVIEW OF SYSTEMS  Negative except as noted here or  in the History of Present Illness.   PHYSICAL EXAMINATION  Initial Vital Signs Blood pressure (!) 151/82, pulse (!) 103, temperature (!) 102.7 F (39.3 C), temperature source Oral, resp. rate 16, height 5\' 6"  (1.676 m), SpO2 99 %, unknown if currently breastfeeding.  Examination General: Well-developed, well-nourished female in no acute distress; appearance consistent with age of record HENT: normocephalic; atraumatic Eyes: pupils equal, round and reactive to light; extraocular muscles intact Neck: supple Heart: regular rate and rhythm Lungs: clear to auscultation bilaterally Abdomen: soft; nondistended; nontender; bowel sounds present Back: Nontender; no pain on movement of back GU: No CVA tenderness Extremities: No deformity; full range of motion; pulses normal Neurologic: Awake, alert and oriented; motor function intact in all extremities and symmetric; no facial droop Skin: Warm and dry Psychiatric: Normal mood and affect   RESULTS  Summary of this visit's results, reviewed and interpreted by myself:   EKG Interpretation  Date/Time:  Saturday November 06 2018 03:25:03 EDT Ventricular Rate:  85 PR Interval:    QRS Duration: 75 QT Interval:  362 QTC Calculation: 431 R Axis:   75 Text Interpretation:  Sinus rhythm U waves present No previous ECGs available Confirmed by Paula LibraMolpus, Jermiah Soderman ([id number]) on 11/06/2018 3:29:41 AM      Laboratory Studies: Results for orders placed or performed  during the hospital encounter of 11/06/18 (from the past 24 hour(s))  Pregnancy, urine     Status: None   Collection Time: 11/06/18  1:23 AM  Result Value Ref Range   Preg Test, Ur NEGATIVE NEGATIVE  Urinalysis, Routine w reflex microscopic     Status: Abnormal   Collection Time: 11/06/18  1:23 AM  Result Value Ref Range   Color, Urine YELLOW YELLOW   APPearance CLOUDY (A) CLEAR   Specific Gravity, Urine <1.005 (L) 1.005 - 1.030   pH 7.0 5.0 - 8.0   Glucose, UA NEGATIVE NEGATIVE mg/dL   Hgb  urine dipstick TRACE (A) NEGATIVE   Bilirubin Urine NEGATIVE NEGATIVE   Ketones, ur NEGATIVE NEGATIVE mg/dL   Protein, ur NEGATIVE NEGATIVE mg/dL   Nitrite NEGATIVE NEGATIVE   Leukocytes,Ua LARGE (A) NEGATIVE  Urinalysis, Microscopic (reflex)     Status: Abnormal   Collection Time: 11/06/18  1:23 AM  Result Value Ref Range   RBC / HPF 0-5 0 - 5 RBC/hpf   WBC, UA 21-50 0 - 5 WBC/hpf   Bacteria, UA MANY (A) NONE SEEN   Squamous Epithelial / LPF 0-5 0 - 5  CBC with Differential/Platelet     Status: Abnormal   Collection Time: 11/06/18  2:15 AM  Result Value Ref Range   WBC 11.7 (H) 4.0 - 10.5 K/uL   RBC 4.42 3.87 - 5.11 MIL/uL   Hemoglobin 11.2 (L) 12.0 - 15.0 g/dL   HCT 34.4 (L) 36.0 - 46.0 %   MCV 77.8 (L) 80.0 - 100.0 fL   MCH 25.3 (L) 26.0 - 34.0 pg   MCHC 32.6 30.0 - 36.0 g/dL   RDW 16.0 (H) 11.5 - 15.5 %   Platelets 325 150 - 400 K/uL   nRBC 0.0 0.0 - 0.2 %   Neutrophils Relative % 85 %   Neutro Abs 9.9 (H) 1.7 - 7.7 K/uL   Lymphocytes Relative 8 %   Lymphs Abs 1.0 0.7 - 4.0 K/uL   Monocytes Relative 6 %   Monocytes Absolute 0.7 0.1 - 1.0 K/uL   Eosinophils Relative 1 %   Eosinophils Absolute 0.1 0.0 - 0.5 K/uL   Basophils Relative 0 %   Basophils Absolute 0.0 0.0 - 0.1 K/uL   Immature Granulocytes 0 %   Abs Immature Granulocytes 0.03 0.00 - 0.07 K/uL  Basic metabolic panel     Status: Abnormal   Collection Time: 11/06/18  2:15 AM  Result Value Ref Range   Sodium 136 135 - 145 mmol/L   Potassium 2.6 (LL) 3.5 - 5.1 mmol/L   Chloride 105 98 - 111 mmol/L   CO2 22 22 - 32 mmol/L   Glucose, Bld 105 (H) 70 - 99 mg/dL   BUN 5 (L) 6 - 20 mg/dL   Creatinine, Ser 0.60 0.44 - 1.00 mg/dL   Calcium 8.3 (L) 8.9 - 10.3 mg/dL   GFR calc non Af Amer >60 >60 mL/min   GFR calc Af Amer >60 >60 mL/min   Anion gap 9 5 - 15   Imaging Studies: No results found.  ED COURSE and MDM  Nursing notes, initial and subsequent vitals signs, including pulse oximetry, reviewed and  interpreted by myself.  Vitals:   11/06/18 0107 11/06/18 0226 11/06/18 0500 11/06/18 0530  BP:  132/78  112/74  Pulse:  92 82 81  Resp:  16 (!) 22 (!) 21  Temp:  100 F (37.8 C)  99.9 F (37.7 C)  TempSrc:  Oral  Oral  SpO2:  99% 98% 98%  Height: 5\' 6"  (1.676 m)      Medications  0.9 %  sodium chloride infusion ( Intravenous Stopped 11/06/18 0535)  acetaminophen (TYLENOL) tablet 1,000 mg (1,000 mg Oral Given 11/06/18 0118)  cefTRIAXone (ROCEPHIN) 1 g in sodium chloride 0.9 % 100 mL IVPB ( Intravenous Stopped 11/06/18 0243)  potassium chloride 20 MEQ/15ML (10%) solution 20 mEq (20 mEq Oral Given 11/06/18 0306)  potassium chloride SA (KLOR-CON) CR tablet 40 mEq (40 mEq Oral Given 11/06/18 0307)  potassium chloride 10 mEq in 100 mL IVPB ( Intravenous Stopped 11/06/18 0535)   Julie Randall was evaluated in Emergency Department on 11/06/2018 for the symptoms described in the history of present illness. She was evaluated in the context of the global COVID-19 pandemic, which necessitated consideration that the patient might be at risk for infection with the SARS-CoV-2 virus that causes COVID-19. Institutional protocols and algorithms that pertain to the evaluation of patients at risk for COVID-19 are in a state of rapid change based on information released by regulatory bodies including the CDC and federal and state organizations. These policies and algorithms were followed during the patient's care in the ED.  3:20 AM Urine and blood sent for culture patient given 1 g of Rocephin IV for urinary tract infection.  CMET shows hypokalemia with a potassium of 2.6, oral and IV repletion of potassium initiated.  5:26 AM Paresthesias and pain improved after potassium repletion.  The cause of the patient's acute hypokalemia is unclear.  Her potassium was normal 2 weeks ago and she has not been vomiting.  We will place her on K-Dur and have her follow-up for potassium recheck in several days if not  improving.  PROCEDURES  Procedures   ED DIAGNOSES     ICD-10-CM   1. Lower urinary tract infectious disease  N39.0   2. Hypokalemia with normal acid-base balance  E87.6        Miaa Latterell, 11/08/2018, MD 11/06/18 [phone]

## 2018-11-06 NOTE — ED Triage Notes (Signed)
Pt is c/o headache and states she has numbness from her hips down into her legs  Pt also states she is having pain in her butt  Symptoms started this evening around 6 pm

## 2018-11-06 NOTE — MAU Provider Note (Addendum)
Chief Complaint: Headache and Fever   First Provider Initiated Contact with Patient 11/06/18 1518      SUBJECTIVE HPI: Julie Randall is a 18 y.o. G1P1001 who is 20 days postpartum following NSVD who presents to maternity admissions reporting onset of pelvic pain and headache yesterday. She reports her pelvic pain is in her hips and upper thighs and is constant but waxes and wanes and is worse in certain positions.  Her h/a is constant, frontal, sharp pain that is unchanged in intensity since onset.  [facility] and was given IV fluids, had labs drawn, and dx with UTI and started on abx. She reports her h/a improved with Tylenol given there but returned when she got home. When she took Tylenol today at home, her h/a was unchanged. She does report pain in her right breast with a plugged duct.  She reports light lochia.    HPI  Past Medical History:  Diagnosis Date  . Sickle cell trait [facility](HCC)    Past Surgical History:  Procedure Laterality Date  . TONSILLECTOMY     Social History   Socioeconomic History  . Marital status: Single    Spouse name: Darren  . Number of children: Not on file  . Years of education: Not on file  . Highest education level: Not on file  Occupational History  . Not on file  Social Needs  . Financial resource strain: Not on file  . Food insecurity    Worry: Not on file    Inability: Not on file  . Transportation needs    Medical: Not on file    Non-medical: Not on file  Tobacco Use  . Smoking status: Former Smoker    Types: Cigars    Quit date: 01/13/2018    Years since quitting: 0.8  . Smokeless tobacco: Never Used  Substance and Sexual Activity  . Alcohol use: No    Frequency: Never  . Drug use: No  . Sexual activity: Yes    Birth control/protection: None  Lifestyle  . Physical activity    Days per week: Not on file    Minutes per session: Not on file  . Stress: Not on file  Relationships  . Social Musicianconnections    Talks  on phone: Not on file    Gets together: Not on file    Attends religious service: Not on file    Active member of club or organization: Not on file    Attends meetings of clubs or organizations: Not on file    Relationship status: Not on file  . Intimate partner violence    Fear of current or ex partner: Not on file    Emotionally abused: Not on file    Physically abused: Not on file    Forced sexual activity: Not on file  Other Topics Concern  . Not on file  Social History Narrative  . Not on file   No current facility-administered medications on file prior to encounter.    Current Outpatient Medications on File Prior to Encounter  Medication Sig Dispense Refill  . cephALEXin (KEFLEX) 500 MG capsule Take 1 capsule (500 mg total) by mouth 2 (two) times daily. 10 capsule 0  . potassium chloride SA (KLOR-CON) 20 MEQ tablet Take 2 tablets (40 mEq total) by mouth daily. 10 tablet 0  . Prenatal Vit-Fe Fumarate-FA (PRENATAL PO) Take 1 tablet by mouth daily.     No Known Allergies  ROS:  Review of Systems  Constitutional: Positive for fever. Negative for chills and fatigue.  Respiratory: Negative for shortness of breath.   Cardiovascular: Negative for chest pain.  Gastrointestinal: Negative for abdominal pain, nausea and vomiting.  Genitourinary: Positive for pelvic pain and vaginal bleeding. Negative for difficulty urinating, dysuria, flank pain, vaginal discharge and vaginal pain.  Neurological: Positive for headaches. Negative for dizziness.  Psychiatric/Behavioral: Negative.      I have reviewed patient's Past Medical Hx, Surgical Hx, Family Hx, Social Hx, medications and allergies.   Physical Exam   Patient Vitals for the past 24 hrs:  BP Temp Temp src Pulse Resp SpO2 Height Weight  11/06/18 1457 138/81 - - 99 - - - -  11/06/18 1418 127/78 (!) 100.5 F (38.1 C) Oral (!) 102 18 100 % - -  11/06/18 1413 - - - - - - 5\' 6"  (1.676 m) 60.8 kg   Constitutional: Well-developed,  well-nourished female in no acute distress.  HEART: normal rate, heart sounds, regular rhythm RESP: normal effort, lung sounds clear and equal bilaterally GI: Abd soft, non-tender. Pos BS x 4 MS: Extremities nontender, no edema, normal ROM Neurologic: Alert and oriented x 4.  GU: Neg CVAT.   LAB RESULTS Results for orders placed or performed during the hospital encounter of 11/06/18 (from the past 24 hour(s))  CBC     Status: Abnormal   Collection Time: 11/06/18  3:25 PM  Result Value Ref Range   WBC 14.0 (H) 4.0 - 10.5 K/uL   RBC 4.74 3.87 - 5.11 MIL/uL   Hemoglobin 12.4 12.0 - 15.0 g/dL   HCT 36.6 36.0 - 46.0 %   MCV 77.2 (L) 80.0 - 100.0 fL   MCH 26.2 26.0 - 34.0 pg   MCHC 33.9 30.0 - 36.0 g/dL   RDW 16.3 (H) 11.5 - 15.5 %   Platelets 298 150 - 400 K/uL   nRBC 0.0 0.0 - 0.2 %  Comprehensive metabolic panel     Status: Abnormal   Collection Time: 11/06/18  3:25 PM  Result Value Ref Range   Sodium 139 135 - 145 mmol/L   Potassium 3.2 (L) 3.5 - 5.1 mmol/L   Chloride 106 98 - 111 mmol/L   CO2 22 22 - 32 mmol/L   Glucose, Bld 90 70 - 99 mg/dL   BUN <5 (L) 6 - 20 mg/dL   Creatinine, Ser 0.73 0.44 - 1.00 mg/dL   Calcium 8.8 (L) 8.9 - 10.3 mg/dL   Total Protein 7.1 6.5 - 8.1 g/dL   Albumin 3.3 (L) 3.5 - 5.0 g/dL   AST 14 (L) 15 - 41 U/L   ALT 12 0 - 44 U/L   Alkaline Phosphatase 128 (H) 38 - 126 U/L   Total Bilirubin 0.4 0.3 - 1.2 mg/dL   GFR calc non Af Amer >60 >60 mL/min   GFR calc Af Amer >60 >60 mL/min   Anion gap 11 5 - 15    --/--/O POS, O POS Performed at [facility], [address], [place], [place] [address]  (10/04 0111)  IMAGING No results found.  MAU Management/MDM: Orders Placed This Encounter  Procedures  . CBC  . Comprehensive metabolic panel  . Protein / creatinine ratio, urine  . Discharge patient    Meds ordered this encounter  Medications  . ibuprofen (ADVIL) tablet 600 mg  . oxyCODONE (Oxy IR/ROXICODONE) immediate release tablet 5  mg  . cephALEXin (KEFLEX) 250 MG capsule    Sig:  Take 1 capsule (250 mg total) by mouth 4 (four) times daily.    Dispense:  28 capsule    Refill:  0    Order Specific Question:   Supervising Provider    Answer:   Adam Phenix [[id number]]  . traMADol (ULTRAM) 50 MG tablet    Sig: Take 1 tablet (50 mg total) by mouth every 6 (six) hours as needed.    Dispense:  6 tablet    Refill:  0    Order Specific Question:   Supervising Provider    Answer:   Adam Phenix [[id number]]    CBC, CMP without evidence of PEC.  P/C ratio normal at 0.18.  WBCs increased in 24 hours from 11 to 14 but pt without acute abdomen, or other emergent concerns.  SARS testing negative at ED visit and no respiratory symptoms.  Pt with pelvic and breast pain. UA at ED with large leukocytes so UTI treatment was initiated.  With breast pain and plugged duct, consider mastitis in differential.  Less likely endometritis without abdominal pain on exam and normal lochia.  Treatments in MAU included ibuprofen 600 mg and Oxycodone 5 mg with complete resolution of headache.  Pt discharged with Rx for Tramadol x 6 tabs and increased Keflex to 500 mg QID x 7 days to cover for mastitis as well as UTI.  Pt given strict return precautions.  ASSESSMENT 1. Fever of unknown origin following delivery, postpartum   2. Mastitis associated with childbirth, delivered   3. Acute cystitis without hematuria     PLAN Discharge home Allergies as of 11/06/2018   No Known Allergies     Medication List    TAKE these medications   cephALEXin 250 MG capsule Commonly known as: KEFLEX Take 1 capsule (250 mg total) by mouth 4 (four) times daily. What changed:   medication strength  how much to take  when to take this   potassium chloride SA 20 MEQ tablet Commonly known as: KLOR-CON Take 2 tablets (40 mEq total) by mouth daily.   PRENATAL PO Take 1 tablet by mouth daily.   traMADol 50 MG tablet Commonly known as: ULTRAM Take 1 tablet (50  mg total) by mouth every 6 (six) hours as needed.      Follow-up Information    [facility] Follow up.   Why: As scheduled for postpartum visit, return to MAU with worsening symptoms. Contact information: [address]  [address] [place] [address] [phone]           Sharen Counter Certified Nurse-Midwife 11/06/2018  5:45 PM

## 2018-11-06 NOTE — ED Notes (Signed)
Date and time results received: 11/06/18 0300 (use smartphrase ".now" to insert current time)  Test: Potassium Critical Value: 2.6  Name of Provider Notified: Dr. Florina Ou  Orders Received? Or Actions Taken?: See MD orders.

## 2018-11-06 NOTE — MAU Note (Signed)
T[place] AUSTRALIA DROLL is a 18 y.o. here in MAU reporting: PP, SVD on 10/4. Around 1800 started having a really bad headache and her legs felt "dead", states she is still feeling this way. Was in [facility] this morning and they diagnosed her with UTI. When she got home from the ED she started feeling bad again. Took her temp and it was 102.  Onset of complaint: yesterday  Pain score: 8/10  Vitals:   11/06/18 1418  BP: 127/78  Pulse: (!) 102  Resp: 18  Temp: (!) 100.5 F (38.1 C)  SpO2: 100%     Lab orders placed from triage: none

## 2018-11-07 LAB — URINE CULTURE: Culture: 50000 — AB

## 2018-11-08 ENCOUNTER — Telehealth: Payer: Self-pay | Admitting: *Deleted

## 2018-11-08 NOTE — Telephone Encounter (Signed)
Post ED Visit - Positive Culture Follow-up  Culture report reviewed by antimicrobial stewardship pharmacist: [facility] [] Nathan Batchelder, Pharm.D. [] Jeremy Frens, Pharm.D., BCPS AQ-ID [] Mike Maccia, Pharm.D., BCPS [] Elizabeth Martin, Pharm.D., BCPS [] Minh Pham, Pharm.D., BCPS, AAHIVP [] Michelle Turner, Pharm.D., BCPS, AAHIVP [x] Rachel Rumbarger, PharmD, BCPS [] Thuy Dang, PharmD, BCPS [] Alison Masters, PharmD, BCPS [] Erin Deja, PharmD [] Catherine Pierce, PharmD, BCPS [] Benjamin Mancheril, PharmD  [facility] [] Michelle Bell, PharmD [] Jigna Gadhia, PharmD [] Nikola Glogovac, PharmD [] Terri Green, Rph [] Rachel (Ellen) Jackson, PharmD [] Justin Legge, PharmD [] Michelle Lilliston, PharmD [] Anh Pham, PharmD [] Leann Poindexter, PharmD [] Christine Shade, PharmD [] Mary Swayne, PharmD [] Erin Williamson, PharmD [] Drew Wofford, PharmD   Positive urine culture Treated with Cephalexin, organism sensitive to the same and no further patient follow-up is required at this time.  Julie Randall 11/08/2018, 10:59 AM   

## 2018-11-11 LAB — CULTURE, BLOOD (ROUTINE X 2)
Culture: NO GROWTH
Culture: NO GROWTH
Special Requests: ADEQUATE
Special Requests: ADEQUATE

## 2019-09-07 ENCOUNTER — Other Ambulatory Visit: Payer: Self-pay

## 2019-09-16 LAB — OB RESULTS CONSOLE GC/CHLAMYDIA
Chlamydia: NEGATIVE
Gonorrhea: NEGATIVE

## 2019-09-16 LAB — OB RESULTS CONSOLE ABO/RH: RH Type: POSITIVE

## 2019-09-16 LAB — OB RESULTS CONSOLE HEPATITIS B SURFACE ANTIGEN: Hepatitis B Surface Ag: NEGATIVE

## 2019-09-16 LAB — OB RESULTS CONSOLE HIV ANTIBODY (ROUTINE TESTING): HIV: NONREACTIVE

## 2019-09-16 LAB — OB RESULTS CONSOLE RUBELLA ANTIBODY, IGM: Rubella: IMMUNE

## 2020-01-14 NOTE — L&D Delivery Note (Signed)
Delivery Note She progressed to a rim and had urge to push.  I was able to reduce the rim when she pushed, she pushed well.  At 11:23 AM a viable female was delivered via Vaginal, Spontaneous (Presentation: Right Occiput Anterior).  APGAR: 8, 9; weight pending.   Placenta status: Spontaneous, Intact.  Cord: 3 vessels with the following complications: None.   Anesthesia: IV Stadol Episiotomy:  None Lacerations:  Hemostatic abrasions Suture Repair: none Est. Blood Loss (mL): 100   Mom to postpartum.  Baby to Couplet care / Skin to Skin.  Leighton Roach Daryle Boyington 04/13/2020, 11:35 AM

## 2020-03-29 LAB — OB RESULTS CONSOLE GBS: GBS: NEGATIVE

## 2020-04-13 ENCOUNTER — Encounter (HOSPITAL_COMMUNITY): Payer: Self-pay | Admitting: Obstetrics and Gynecology

## 2020-04-13 ENCOUNTER — Other Ambulatory Visit: Payer: Self-pay

## 2020-04-13 ENCOUNTER — Inpatient Hospital Stay (HOSPITAL_COMMUNITY)
Admission: AD | Admit: 2020-04-13 | Discharge: 2020-04-15 | DRG: 807 | Disposition: A | Discharge status: Home / Self Care | Payer: BC Managed Care – PPO | Attending: Obstetrics and Gynecology | Admitting: Obstetrics and Gynecology

## 2020-04-13 DIAGNOSIS — O133 Gestational [pregnancy-induced] hypertension without significant proteinuria, third trimester: Secondary | ICD-10-CM

## 2020-04-13 DIAGNOSIS — D573 Sickle-cell trait: Secondary | ICD-10-CM | POA: Diagnosis present

## 2020-04-13 DIAGNOSIS — D509 Iron deficiency anemia, unspecified: Secondary | ICD-10-CM | POA: Diagnosis present

## 2020-04-13 DIAGNOSIS — O9902 Anemia complicating childbirth: Secondary | ICD-10-CM | POA: Diagnosis present

## 2020-04-13 DIAGNOSIS — Z87891 Personal history of nicotine dependence: Secondary | ICD-10-CM | POA: Diagnosis not present

## 2020-04-13 DIAGNOSIS — O134 Gestational [pregnancy-induced] hypertension without significant proteinuria, complicating childbirth: Secondary | ICD-10-CM | POA: Diagnosis present

## 2020-04-13 DIAGNOSIS — O479 False labor, unspecified: Secondary | ICD-10-CM

## 2020-04-13 DIAGNOSIS — Z3A38 38 weeks gestation of pregnancy: Secondary | ICD-10-CM

## 2020-04-13 DIAGNOSIS — O139 Gestational [pregnancy-induced] hypertension without significant proteinuria, unspecified trimester: Secondary | ICD-10-CM | POA: Diagnosis present

## 2020-04-13 DIAGNOSIS — Z20822 Contact with and (suspected) exposure to covid-19: Secondary | ICD-10-CM | POA: Diagnosis present

## 2020-04-13 LAB — RESP PANEL BY RT-PCR (FLU A&B, COVID) ARPGX2
Influenza A by PCR: NEGATIVE
Influenza B by PCR: NEGATIVE
SARS Coronavirus 2 by RT PCR: NEGATIVE

## 2020-04-13 LAB — COMPREHENSIVE METABOLIC PANEL
ALT: 8 U/L (ref 0–44)
AST: 12 U/L — ABNORMAL LOW (ref 15–41)
Albumin: 2.5 g/dL — ABNORMAL LOW (ref 3.5–5.0)
Alkaline Phosphatase: 189 U/L — ABNORMAL HIGH (ref 38–126)
Anion gap: 7 (ref 5–15)
BUN: <5 mg/dL — ABNORMAL LOW (ref 6–20)
CO2: 25 mmol/L (ref 22–32)
Calcium: 8.4 mg/dL — ABNORMAL LOW (ref 8.9–10.3)
Chloride: 104 mmol/L (ref 98–111)
Creatinine, Ser: 0.57 mg/dL (ref 0.44–1.00)
GFR, Estimated: >60 mL/min (ref >60)
Glucose, Bld: 97 mg/dL (ref 70–99)
Potassium: 3.1 mmol/L — ABNORMAL LOW (ref 3.5–5.1)
Sodium: 136 mmol/L (ref 135–145)
Total Bilirubin: 0.7 mg/dL (ref 0.3–1.2)
Total Protein: 6.2 g/dL — ABNORMAL LOW (ref 6.5–8.1)

## 2020-04-13 LAB — CBC
HCT: 29.6 % — ABNORMAL LOW (ref 36.0–46.0)
Hemoglobin: 9.7 g/dL — ABNORMAL LOW (ref 12.0–15.0)
MCH: 24.3 pg — ABNORMAL LOW (ref 26.0–34.0)
MCHC: 32.8 g/dL (ref 30.0–36.0)
MCV: 74.2 fL — ABNORMAL LOW (ref 80.0–100.0)
Platelets: 357 10*3/uL (ref 150–400)
RBC: 3.99 MIL/uL (ref 3.87–5.11)
RDW: 17.2 % — ABNORMAL HIGH (ref 11.5–15.5)
WBC: 12.8 10*3/uL — ABNORMAL HIGH (ref 4.0–10.5)
nRBC: 0.2 % (ref 0.0–0.2)

## 2020-04-13 LAB — TYPE AND SCREEN
ABO/RH(D): O POS
Antibody Screen: NEGATIVE

## 2020-04-13 LAB — PROTEIN / CREATININE RATIO, URINE
Creatinine, Urine: 21.73 mg/dL
Total Protein, Urine: <6 mg/dL

## 2020-04-13 LAB — RPR: RPR Ser Ql: NONREACTIVE

## 2020-04-13 MED ORDER — OXYCODONE HCL 5 MG PO TABS: 10.0000 mg | ORAL_TABLET | ORAL | Status: DC | PRN

## 2020-04-13 MED ORDER — SOD CITRATE-CITRIC ACID 500-334 MG/5ML PO SOLN: 30.0000 mL | ORAL | Status: DC | PRN

## 2020-04-13 MED ORDER — METHYLERGONOVINE MALEATE 0.2 MG/ML IJ SOLN
0.2000 mg | INTRAMUSCULAR | Status: DC | PRN
Start: 2020-04-13 — End: 2020-04-15

## 2020-04-13 MED ORDER — PRENATAL MULTIVITAMIN CH
1.0000 | ORAL_TABLET | Freq: Every day | ORAL | Status: DC
  Administered 2020-04-14: 1 via ORAL
  Filled 2020-04-13: qty 1

## 2020-04-13 MED ORDER — MEASLES, MUMPS & RUBELLA VAC IJ SOLR: 0.5000 mL | Freq: Once | INTRAMUSCULAR | Status: DC

## 2020-04-13 MED ORDER — LACTATED RINGERS IV SOLN: 500.0000 mL | INTRAVENOUS | Status: DC | PRN

## 2020-04-13 MED ORDER — OXYCODONE HCL 5 MG PO TABS: 5.0000 mg | ORAL_TABLET | ORAL | Status: DC | PRN

## 2020-04-13 MED ORDER — ONDANSETRON HCL 4 MG/2ML IJ SOLN: 4.0000 mg | INTRAMUSCULAR | Status: DC | PRN

## 2020-04-13 MED ORDER — LIDOCAINE HCL (PF) 1 % IJ SOLN: 30.0000 mL | INTRAMUSCULAR | Status: DC | PRN

## 2020-04-13 MED ORDER — SENNOSIDES-DOCUSATE SODIUM 8.6-50 MG PO TABS
2.0000 | ORAL_TABLET | Freq: Every day | ORAL | Status: DC
  Administered 2020-04-14: 2 via ORAL
  Filled 2020-04-13: qty 2

## 2020-04-13 MED ORDER — BUTORPHANOL TARTRATE 1 MG/ML IJ SOLN
1.0000 mg | INTRAMUSCULAR | Status: DC | PRN
  Administered 2020-04-13: 1 mg via INTRAVENOUS
  Filled 2020-04-13: qty 1

## 2020-04-13 MED ORDER — OXYCODONE-ACETAMINOPHEN 5-325 MG PO TABS: 1.0000 | ORAL_TABLET | ORAL | Status: DC | PRN

## 2020-04-13 MED ORDER — ACETAMINOPHEN 325 MG PO TABS: 650.0000 mg | ORAL_TABLET | ORAL | Status: DC | PRN

## 2020-04-13 MED ORDER — ACETAMINOPHEN 325 MG PO TABS
650.0000 mg | ORAL_TABLET | ORAL | Status: DC | PRN
  Administered 2020-04-13: 650 mg via ORAL
  Filled 2020-04-13: qty 2

## 2020-04-13 MED ORDER — IBUPROFEN 600 MG PO TABS
600.0000 mg | ORAL_TABLET | Freq: Four times a day (QID) | ORAL | Status: DC
  Administered 2020-04-13 – 2020-04-15 (×6): 600 mg via ORAL
  Filled 2020-04-13 (×6): qty 1

## 2020-04-13 MED ORDER — OXYCODONE-ACETAMINOPHEN 5-325 MG PO TABS: 2.0000 | ORAL_TABLET | ORAL | Status: DC | PRN

## 2020-04-13 MED ORDER — BENZOCAINE-MENTHOL 20-0.5 % EX AERO: 1.0000 "application " | INHALATION_SPRAY | CUTANEOUS | Status: DC | PRN

## 2020-04-13 MED ORDER — COCONUT OIL OIL
1.0000 "application " | TOPICAL_OIL | Status: DC | PRN
  Administered 2020-04-15: 1 via TOPICAL

## 2020-04-13 MED ORDER — ONDANSETRON HCL 4 MG/2ML IJ SOLN: 4.0000 mg | Freq: Four times a day (QID) | INTRAMUSCULAR | Status: DC | PRN

## 2020-04-13 MED ORDER — OXYTOCIN-SODIUM CHLORIDE 30-0.9 UT/500ML-% IV SOLN
2.5000 [IU]/h | INTRAVENOUS | Status: DC
  Filled 2020-04-13: qty 500

## 2020-04-13 MED ORDER — TETANUS-DIPHTH-ACELL PERTUSSIS 5-2.5-18.5 LF-MCG/0.5 IM SUSY: 0.5000 mL | PREFILLED_SYRINGE | Freq: Once | INTRAMUSCULAR | Status: DC

## 2020-04-13 MED ORDER — DIPHENHYDRAMINE HCL 25 MG PO CAPS: 25.0000 mg | ORAL_CAPSULE | Freq: Four times a day (QID) | ORAL | Status: DC | PRN

## 2020-04-13 MED ORDER — MAGNESIUM HYDROXIDE 400 MG/5ML PO SUSP: 30.0000 mL | ORAL | Status: DC | PRN

## 2020-04-13 MED ORDER — LACTATED RINGERS IV SOLN: INTRAVENOUS | Status: DC

## 2020-04-13 MED ORDER — OXYTOCIN BOLUS FROM INFUSION: 333.0000 mL | Freq: Once | INTRAVENOUS | Status: DC

## 2020-04-13 MED ORDER — POTASSIUM CHLORIDE CRYS ER 20 MEQ PO TBCR
40.0000 meq | EXTENDED_RELEASE_TABLET | Freq: Two times a day (BID) | ORAL | Status: DC
  Administered 2020-04-13 – 2020-04-14 (×4): 40 meq via ORAL
  Filled 2020-04-13 (×6): qty 2

## 2020-04-13 MED ORDER — ONDANSETRON HCL 4 MG PO TABS: 4.0000 mg | ORAL_TABLET | ORAL | Status: DC | PRN

## 2020-04-13 MED ORDER — DIBUCAINE (PERIANAL) 1 % EX OINT: 1.0000 "application " | TOPICAL_OINTMENT | CUTANEOUS | Status: DC | PRN

## 2020-04-13 MED ORDER — SIMETHICONE 80 MG PO CHEW
80.0000 mg | CHEWABLE_TABLET | ORAL | Status: DC | PRN
Start: 2020-04-13 — End: 2020-04-15

## 2020-04-13 MED ORDER — WITCH HAZEL-GLYCERIN EX PADS: 1.0000 "application " | MEDICATED_PAD | CUTANEOUS | Status: DC | PRN

## 2020-04-13 MED ORDER — METHYLERGONOVINE MALEATE 0.2 MG PO TABS: 0.2000 mg | ORAL_TABLET | ORAL | Status: DC | PRN

## 2020-04-13 MED ORDER — ZOLPIDEM TARTRATE 5 MG PO TABS
5.0000 mg | ORAL_TABLET | Freq: Every evening | ORAL | Status: DC | PRN
Start: 2020-04-13 — End: 2020-04-15

## 2020-04-13 MED ORDER — BUTORPHANOL TARTRATE 1 MG/ML IJ SOLN
1.0000 mg | INTRAMUSCULAR | Status: DC | PRN
Start: 2020-04-13 — End: 2020-04-13
  Administered 2020-04-13 (×2): 1 mg via INTRAVENOUS
  Filled 2020-04-13 (×2): qty 1

## 2020-04-13 NOTE — H&P (Signed)
Julie Randall is a 20 y.o. female, G2 P1001, EGA 38+ weeks with EDC 4-12 presenting for ctx.  On eval in MAU, irreg ctx, VE 4 cm without change.  However, had some BP up to 140/90, normal PIH labs.  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  OB History    Gravida  2   Para  1   Term  1   Preterm  0   AB  0   Living  1     SAB  0   IAB  0   Ectopic  0   Multiple  0   Live Births  1          Past Medical History:  Diagnosis Date  . Sickle cell trait [facility])    Past Surgical History:  Procedure Laterality Date  . TONSILLECTOMY     Family History: family history includes Breast cancer in her maternal grandmother and mother. Social History:  reports that she quit smoking about 2 years ago. Her smoking use included cigars. She has never used smokeless tobacco. She reports that she does not drink alcohol and does not use drugs.     Maternal Diabetes: No Genetic Screening: Normal Maternal Ultrasounds/Referrals: Isolated EIF (echogenic intracardiac focus) Fetal Ultrasounds or other Referrals:  None Maternal Substance Abuse:  No Significant Maternal Medications:  None Significant Maternal Lab Results:  Group B Strep negative Other Comments:  None  Review of Systems  Respiratory: Negative.   Cardiovascular: Negative.    Maternal Medical History:  Reason for admission: Contractions.   Contractions: Frequency: irregular.   Perceived severity is moderate.    Fetal activity: Perceived fetal activity is normal.    Prenatal Complications - Diabetes: none.   5/70/-2, vtx, AROM clear Dilation: 4 Effacement (%): 70 Station: -2 Exam by:: [name] Blood pressure (!) 147/87, pulse 79, temperature 98.5 F (36.9 C), temperature source Oral, resp. rate 16, height 5\' 6"  (1.676 m), weight 76.2 kg, SpO2 100 %, unknown if currently breastfeeding. Maternal Exam:  Uterine Assessment: Contraction strength is mild.   Contraction frequency is irregular.   Abdomen: Patient reports no abdominal tenderness. Estimated fetal weight is 7.5 lbs.   Fetal presentation: vertex  Introitus: Normal vulva. Normal vagina.  Amniotic fluid character: clear.  Pelvis: adequate for delivery.      Fetal Exam Fetal Monitor Review: Mode: ultrasound.   Baseline rate: 130.  Variability: moderate (6-25 bpm).   Pattern: accelerations present and no decelerations.    Fetal State Assessment: Category I - tracings are normal.     Physical Exam Vitals reviewed.  Constitutional:      Appearance: Normal appearance.  Cardiovascular:     Rate and Rhythm: Normal rate and regular rhythm.  Pulmonary:     Effort: Pulmonary effort is normal. No respiratory distress.  Abdominal:     Palpations: Abdomen is soft.  Genitourinary:    General: Normal vulva.  Neurological:     Mental Status: She is alert.     Prenatal labs: ABO, Rh: --/--/O POS (04/01 10-13-1993) Antibody: NEG (04/01 0615) Rubella: Immune (09/03 0000) RPR:   NR HBsAg: Negative (09/03 0000)  HIV: Non-reactive (09/03 0000)  GBS: Negative/-- (03/17 0000)   Assessment/Plan: IUP at 38+ weeks, latent labor, PIH.  Admitted, AROM done for augmentation, will monitor progress and BP, anticipate SVD   04-07-1983 Julie Randall 04/13/2020, 7:35 AM

## 2020-04-13 NOTE — MAU Provider Note (Signed)
Chief Complaint:  Contractions   Event Date/Time   First Provider Initiated Contact with Patient 04/13/20 0421     HPI: Julie Randall is a 20 y.o. G2P1001 at 64w3dwho presents to maternity admissions reporting uterine contractions.  BP noted to be elevated on first two readings.   Chart review showed elevated BP a week ago.  . She reports good fetal movement, denies LOF, vaginal bleeding, vaginal itching/burning, urinary symptoms, h/a, dizziness, n/v, diarrhea, constipation or fever/chills.  She denies headache, visual changes or RUQ abdominal pain.  Hypertension This is a recurrent problem. The current episode started 1 to 4 weeks ago. The problem is unchanged. Pertinent negatives include no anxiety, blurred vision, chest pain, headaches, peripheral edema or shortness of breath. There are no associated agents to hypertension. Risk factors: PRegnancy. Past treatments include nothing. There are no compliance problems.    RN Note: Julie Randall is a 20 y.o. at [redacted]w[redacted]d here in MAU reporting: ctx that are 5 mins apart that started around 0100 this morning. Per pt she was 3cm in the office yesterday and they swept her membranes. Denies VB and LOF. Endorses good fetal movement  Past Medical History: Past Medical History:  Diagnosis Date  . Sickle cell trait (HCC)     Past obstetric history: OB History  Gravida Para Term Preterm AB Living  2 1 1  0 0 1  SAB IAB Ectopic Multiple Live Births  0 0 0 0 1    # Outcome Date GA Lbr Len/2nd Weight Sex Delivery Anes PTL Lv  2 Current           1 Term 10/17/18 [redacted]w[redacted]d 11:32 / 00:06 2715 g F Vag-Spont Local  LIV    Past Surgical History: Past Surgical History:  Procedure Laterality Date  . TONSILLECTOMY      Family History: Family History  Problem Relation Age of Onset  . Breast cancer Mother   . Breast cancer Maternal Grandmother     Social History: Social History   Tobacco Use  . Smoking status: Former Smoker    Types: Cigars     Quit date: 01/13/2018    Years since quitting: 2.2  . Smokeless tobacco: Never Used  Vaping Use  . Vaping Use: Never used  Substance Use Topics  . Alcohol use: No  . Drug use: No    Allergies: No Known Allergies  Meds:  Medications Prior to Admission  Medication Sig Dispense Refill Last Dose  . cephALEXin (KEFLEX) 250 MG capsule Take 1 capsule (250 mg total) by mouth 4 (four) times daily. 28 capsule 0   . potassium chloride SA (KLOR-CON) 20 MEQ tablet Take 2 tablets (40 mEq total) by mouth daily. 10 tablet 0   . Prenatal Vit-Fe Fumarate-FA (PRENATAL PO) Take 1 tablet by mouth daily.     . traMADol (ULTRAM) 50 MG tablet Take 1 tablet (50 mg total) by mouth every 6 (six) hours as needed. 6 tablet 0     I have reviewed patient's Past Medical Hx, Surgical Hx, Family Hx, Social Hx, medications and allergies.   ROS:  Review of Systems  Eyes: Negative for blurred vision.  Respiratory: Negative for shortness of breath.   Cardiovascular: Negative for chest pain.  Neurological: Negative for headaches.   Other systems negative  Physical Exam   Patient Vitals for the past 24 hrs:  BP Temp Temp src Pulse Resp SpO2  04/13/20 0416 (!) 143/85 -- -- 77 -- 100 %  04/13/20 0401 06/13/20)  143/90 -- -- 87 -- 100 %  04/13/20 0352 (!) 149/93 98.8 F (37.1 C) Oral 80 16 99 %   Vitals:   04/13/20 0401 04/13/20 0416 04/13/20 0430 04/13/20 0446  BP: (!) 143/90 (!) 143/85 123/69 129/70  Pulse: 87 77 82 79  Resp:      Temp:      TempSrc:      SpO2: 100% 100% 99% 100%    Constitutional: Well-developed, well-nourished female in no acute distress.  Cardiovascular: normal rate and rhythm Respiratory: normal effort, clear to auscultation bilaterally GI: Abd soft, non-tender, gravid appropriate for gestational age.   No rebound or guarding. MS: Extremities nontender, no edema, normal ROM Neurologic: Alert and oriented x 4. DTRs 2+ with no clonus GU: Neg CVAT.  PELVIC EXAM:  Dilation: 3.5 Effacement  (%): 70 Cervical Position: Posterior Station: -2 Exam by:: [name]  FHT:  Baseline 130 , moderate variability, accelerations present, no decelerations Contractions: Irregular     Labs: Results for orders placed or performed during the hospital encounter of 04/13/20 (from the past 24 hour(s))  Protein / creatinine ratio, urine     Status: None   Collection Time: 04/13/20  4:22 AM  Result Value Ref Range   Creatinine, Urine 21.73 mg/dL   Total Protein, Urine <6 mg/dL   Protein Creatinine Ratio        0.00 - 0.15 mg/mg[Cre]  CBC     Status: Abnormal   Collection Time: 04/13/20  4:41 AM  Result Value Ref Range   WBC 12.8 (H) 4.0 - 10.5 K/uL   RBC 3.99 3.87 - 5.11 MIL/uL   Hemoglobin 9.7 (L) 12.0 - 15.0 g/dL   HCT 62.8 (L) 31.5 - 17.6 %   MCV 74.2 (L) 80.0 - 100.0 fL   MCH 24.3 (L) 26.0 - 34.0 pg   MCHC 32.8 30.0 - 36.0 g/dL   RDW 16.0 (H) 73.7 - 10.6 %   Platelets 357 150 - 400 K/uL   nRBC 0.2 0.0 - 0.2 %  Comprehensive metabolic panel     Status: Abnormal   Collection Time: 04/13/20  4:41 AM  Result Value Ref Range   Sodium 136 135 - 145 mmol/L   Potassium 3.1 (L) 3.5 - 5.1 mmol/L   Chloride 104 98 - 111 mmol/L   CO2 25 22 - 32 mmol/L   Glucose, Bld 97 70 - 99 mg/dL   BUN <5 (L) 6 - 20 mg/dL   Creatinine, Ser 2.69 0.44 - 1.00 mg/dL   Calcium 8.4 (L) 8.9 - 10.3 mg/dL   Total Protein 6.2 (L) 6.5 - 8.1 g/dL   Albumin 2.5 (L) 3.5 - 5.0 g/dL   AST 12 (L) 15 - 41 U/L   ALT 8 0 - 44 U/L   Alkaline Phosphatase 189 (H) 38 - 126 U/L   Total Bilirubin 0.7 0.3 - 1.2 mg/dL   GFR, Estimated >48 >54 mL/min   Anion gap 7 5 - 15    Imaging:  No results found.  MAU Course/MDM: I have ordered labs and reviewed results.  These are normal  NST reviewed, reassuring Consult Dr Mindi Slicker (by RN) with presentation, exam findings and test results. She wants me to do her workup with exam and labs. Does not want to admit at this time.  >>  Consulted Dr Jolayne Panther with VS, lab results and  exam findings.  She recommends admission.  Reconsulted Dr Mindi Slicker with findings, VS and lab results  Plans to admit  patient for delivery Treatments in MAU included EFM.    Assessment: Single IUP at [redacted]w[redacted]d Gestational Hypertension  Plan: Admit to Labor and Delivery Routine orders MD to follow   Wynelle Bourgeois CNM, MSN Certified Nurse-Midwife 04/13/2020 4:21 AM

## 2020-04-13 NOTE — MAU Note (Signed)
Dr. Mindi Slicker notified of elevated BP in mau as well as elevated BP in the office. Requesting that we keep the patient and do labor eval and pre eclampsia workup. Artelia Laroche, CNM notified.

## 2020-04-13 NOTE — Progress Notes (Signed)
During PP Recovery patient's BPs have been 140s/80-90s.  Patient denies HA, blurred vision, or RUQ pain.    Phoned Dr. Jackelyn Knife with above information.  He said that because this morning's labs did not indicate a Preeclampsia diagnosis, he wants to continue to monitor BPs and see how they trend.  No new orders at this time.

## 2020-04-13 NOTE — MAU Note (Signed)
..  Julie Randall is a 20 y.o. at [redacted]w[redacted]d here in MAU reporting: ctx that are 5 mins apart that started around 0100 this morning. Per pt she was 3cm in the office yesterday and they swept her membranes. Denies VB and LOF. Endorses good fetal movement.

## 2020-04-13 NOTE — Lactation Note (Signed)
This note was copied from a baby's chart. Lactation Consultation Note  Patient Name: Julie Randall Today's Date: 04/13/2020   Age: 22 mins  LC arrived to see mother in L&D, Mother and infant STS. Mother ask if she had latched the baby yet and she said no. LC ask if she would like some assistance to latch her now. Mother kindly say she was tired and she would like to wait until she got to her room. Congratulated mother and told her she would have assistance when she gets to her room.  Maternal Data    Feeding    LATCH Score                    Lactation Tools Discussed/Used    Interventions    Discharge    Consult Status      Michel Bickers 04/13/2020, 12:58 PM

## 2020-04-14 NOTE — Progress Notes (Signed)
PPD #1 No problems Afeb, VSS, BP 125-147/67-91 Fundus firm, NT at U-1 Continue routine postpartum care, will keep her today and monitor BP

## 2020-04-15 MED ORDER — IBUPROFEN 600 MG PO TABS: 600.0000 mg | ORAL_TABLET | Freq: Four times a day (QID) | ORAL | 0 refills | Status: AC

## 2020-04-15 NOTE — Discharge Instructions (Signed)
As per discharge pamphlet °

## 2020-04-15 NOTE — Discharge Summary (Signed)
Postpartum Discharge Summary      Patient Name: Julie Randall DOB: [birth year] MRN: [id number]  Date of admission: 04/13/2020 Delivery date:04/13/2020  Delivering provider: Lavina Hamman  Date of discharge: 04/15/2020  Admitting diagnosis: Gestational hypertension [O13.9] Intrauterine pregnancy: [redacted]w[redacted]d     Secondary diagnosis:  Active Problems:   Gestational hypertension     Discharge diagnosis: Term Pregnancy Delivered and Gestational Hypertension                                               Hospital course: Onset of Labor With Vaginal Delivery      20 y.o. yo [id number] at [redacted]w[redacted]d was admitted in Latent Labor with some mildly elevated BP, normal PIH labs on 04/13/2020. Patient had an uncomplicated labor course as follows:  Membrane Rupture Time/Date: 7:30 AM ,04/13/2020   Delivery Method:Vaginal, Spontaneous  Episiotomy: None  Lacerations:  None  Patient had an uncomplicated postpartum course. BP did not require treatment.  Also received PO potassium replacement.  She is ambulating, tolerating a regular diet, passing flatus, and urinating well. Patient is discharged home in stable condition on 04/15/20.  Newborn Data: Birth date:04/13/2020  Birth time:11:23 AM  Gender:Female  Living status:Living  Apgars:8 ,9  Weight:3380 g    Physical exam  Vitals:   04/14/20 0200 04/14/20 0607 04/14/20 1444 04/14/20 2106  BP:  133/67 (!) 145/87 135/86  Pulse: 77 72 76 79  Resp: 16 16 18 16   Temp: 98.6 F (37 C) 98.1 F (36.7 C) 99 F (37.2 C) 98.5 F (36.9 C)  TempSrc: Oral  Oral Oral  SpO2:  100% 98% 100%  Weight:      Height:       General: alert Lochia: appropriate Uterine Fundus: firm  Labs: Lab Results  Component Value Date   WBC 12.8 (H) 04/13/2020   HGB 9.7 (L) 04/13/2020   HCT 29.6 (L) 04/13/2020   MCV 74.2 (L) 04/13/2020   PLT 357 04/13/2020   CMP Latest Ref Rng & Units 04/13/2020  Glucose 70 - 99 mg/dL 97  BUN 6 - 20 mg/dL 06/13/2020)  Creatinine <7(Q - 1.00 mg/dL  7.34  Sodium 1.93 - 790 mmol/L 136  Potassium 3.5 - 5.1 mmol/L 3.1(L)  Chloride 98 - 111 mmol/L 104  CO2 22 - 32 mmol/L 25  Calcium 8.9 - 10.3 mg/dL 240)  Total Protein 6.5 - 8.1 g/dL 6.2(L)  Total Bilirubin 0.3 - 1.2 mg/dL 0.7  Alkaline Phos 38 - 126 U/L 189(H)  AST 15 - 41 U/L 12(L)  ALT 0 - 44 U/L 8   Edinburgh Score: Edinburgh Postnatal Depression Scale Screening Tool 04/14/2020  I have been able to laugh and see the funny side of things. 0  I have looked forward with enjoyment to things. 0  I have blamed myself unnecessarily when things went wrong. 1  I have been anxious or worried for no good reason. 2  I have felt scared or panicky for no good reason. 0  Things have been getting on top of me. 1  I have been so unhappy that I have had difficulty sleeping. 1  I have felt sad or miserable. 0  I have been so unhappy that I have been crying. 0  The thought of harming myself has occurred to me. 0  Edinburgh Postnatal Depression Scale Total 5  After visit meds:  Allergies as of 04/15/2020   No Known Allergies     Medication List    STOP taking these medications   cephALEXin 250 MG capsule Commonly known as: KEFLEX   traMADol 50 MG tablet Commonly known as: ULTRAM     TAKE these medications   ibuprofen 600 MG tablet Commonly known as: ADVIL Take 1 tablet (600 mg total) by mouth every 6 (six) hours.   potassium chloride SA 20 MEQ tablet Commonly known as: KLOR-CON Take 2 tablets (40 mEq total) by mouth daily.   PRENATAL PO Take 1 tablet by mouth daily.        Discharge home in stable condition Infant Feeding: Breast Infant Disposition:home with mother Discharge instruction: per After Visit Summary and Postpartum booklet. Activity: Advance as tolerated. Pelvic rest for 6 weeks.  Diet: routine diet  Postpartum Appointment:2-3 days Follow up Visit:  Follow-up Information    Mandrell Vangilder, MD. Schedule an appointment as soon as possible for a visit  in 3 day(s).   Specialty: Obstetrics and Gynecology Why: for BP check Contact information: [address], [address] [place] [address] [phone]                   04/15/2020 Zenaida Niece, MD

## 2020-04-15 NOTE — Progress Notes (Signed)
PPD #2 Feels fine Afeb, VSS, BP 130-140/60-80 D/c home

## 2020-04-19 ENCOUNTER — Inpatient Hospital Stay (HOSPITAL_COMMUNITY): Payer: BC Managed Care – PPO

## 2020-04-19 ENCOUNTER — Inpatient Hospital Stay (HOSPITAL_COMMUNITY)
Admission: AD | Admit: 2020-04-19 | Payer: BC Managed Care – PPO | Source: Home / Self Care | Admitting: Obstetrics and Gynecology

## 2020-04-29 ENCOUNTER — Other Ambulatory Visit: Payer: Self-pay

## 2020-04-29 ENCOUNTER — Inpatient Hospital Stay (HOSPITAL_COMMUNITY)
Admission: AD | Admit: 2020-04-29 | Discharge: 2020-04-29 | Disposition: A | Discharge status: Home / Self Care | Payer: BC Managed Care – PPO | Attending: Obstetrics and Gynecology | Admitting: Obstetrics and Gynecology

## 2020-04-29 ENCOUNTER — Encounter (HOSPITAL_COMMUNITY): Payer: Self-pay | Admitting: Obstetrics and Gynecology

## 2020-04-29 DIAGNOSIS — Z3A38 38 weeks gestation of pregnancy: Secondary | ICD-10-CM | POA: Diagnosis not present

## 2020-04-29 DIAGNOSIS — N61 Mastitis without abscess: Secondary | ICD-10-CM

## 2020-04-29 DIAGNOSIS — O99893 Other specified diseases and conditions complicating puerperium: Secondary | ICD-10-CM | POA: Diagnosis not present

## 2020-04-29 DIAGNOSIS — O91213 Nonpurulent mastitis associated with pregnancy, third trimester: Secondary | ICD-10-CM | POA: Diagnosis not present

## 2020-04-29 DIAGNOSIS — Z87891 Personal history of nicotine dependence: Secondary | ICD-10-CM | POA: Diagnosis not present

## 2020-04-29 LAB — URINALYSIS, ROUTINE W REFLEX MICROSCOPIC
Bacteria, UA: NONE SEEN
Bilirubin Urine: NEGATIVE
Glucose, UA: NEGATIVE mg/dL
Ketones, ur: NEGATIVE mg/dL
Leukocytes,Ua: NEGATIVE
Nitrite: NEGATIVE
Protein, ur: NEGATIVE mg/dL
Specific Gravity, Urine: 1.002 — ABNORMAL LOW (ref 1.005–1.030)
pH: 7 (ref 5.0–8.0)

## 2020-04-29 LAB — COMPREHENSIVE METABOLIC PANEL WITH GFR
ALT: 12 U/L (ref 0–44)
AST: 12 U/L — ABNORMAL LOW (ref 15–41)
Albumin: 3.1 g/dL — ABNORMAL LOW (ref 3.5–5.0)
Alkaline Phosphatase: 107 U/L (ref 38–126)
Anion gap: 9 (ref 5–15)
BUN: <5 mg/dL — ABNORMAL LOW (ref 6–20)
CO2: 24 mmol/L (ref 22–32)
Calcium: 8.4 mg/dL — ABNORMAL LOW (ref 8.9–10.3)
Chloride: 101 mmol/L (ref 98–111)
Creatinine, Ser: 0.67 mg/dL (ref 0.44–1.00)
GFR, Estimated: >60 mL/min (ref >60)
Glucose, Bld: 95 mg/dL (ref 70–99)
Potassium: 2.9 mmol/L — ABNORMAL LOW (ref 3.5–5.1)
Sodium: 134 mmol/L — ABNORMAL LOW (ref 135–145)
Total Bilirubin: 0.6 mg/dL (ref 0.3–1.2)
Total Protein: 7.4 g/dL (ref 6.5–8.1)

## 2020-04-29 LAB — CBC
HCT: 34.3 % — ABNORMAL LOW (ref 36.0–46.0)
Hemoglobin: 11.2 g/dL — ABNORMAL LOW (ref 12.0–15.0)
MCH: 24 pg — ABNORMAL LOW (ref 26.0–34.0)
MCHC: 32.7 g/dL (ref 30.0–36.0)
MCV: 73.6 fL — ABNORMAL LOW (ref 80.0–100.0)
Platelets: 466 10*3/uL — ABNORMAL HIGH (ref 150–400)
RBC: 4.66 MIL/uL (ref 3.87–5.11)
RDW: 19.5 % — ABNORMAL HIGH (ref 11.5–15.5)
WBC: 22.6 10*3/uL — ABNORMAL HIGH (ref 4.0–10.5)
nRBC: 0 % (ref 0.0–0.2)

## 2020-04-29 MED ORDER — DICLOXACILLIN SODIUM 500 MG PO CAPS: 500.0000 mg | ORAL_CAPSULE | Freq: Four times a day (QID) | ORAL | 0 refills | Status: AC

## 2020-04-29 MED ORDER — ACETAMINOPHEN 500 MG PO TABS
1000.0000 mg | ORAL_TABLET | Freq: Once | ORAL | Status: AC
  Administered 2020-04-29: 1000 mg via ORAL
  Filled 2020-04-29: qty 2

## 2020-04-29 NOTE — MAU Provider Note (Signed)
Chief Complaint:  Headache   Event Date/Time   First Provider Initiated Contact with Patient 04/29/20 1425     HPI: Julie Randall is a 20 y.o. [id number] at [redacted]w[redacted]d who presents to maternity admissions reporting right lump and pain in breast, chills, fatigue, and headache. Patient is s/p SVD on 04/13/20. Reports she noticed a lump in her right breast along with sharp pain 2-3 days ago. Lump has gotten bigger and pain is worse. Started developing chills, fatigue, and headache last night. She has not taken her temp at home. Took Ibuprofen last night and again at 7am this morning without relief. Rates headache 9/10. Thinks her symptoms are related to mastitis as she had it with her first pregnancy and her symptoms are the same as before. She says that she pumps every 2-3 hours, has tried massaging her breast, using warm compresses, and taking warm showers without relief. Takes Procardia as prescribed daily. She denies vision changes, RUQ pain, abdominal pain, pelvic pain, vaginal bleeding, or vaginal discharge.   Pregnancy Course:   Past Medical History:  Diagnosis Date   Sickle cell trait (HCC)    OB History  Gravida Para Term Preterm AB Living  2 2 2  0 0 2  SAB IAB Ectopic Multiple Live Births  0 0 0 0 2    # Outcome Date GA Lbr Len/2nd Weight Sex Delivery Anes PTL Lv  2 Term 04/13/20 [redacted]w[redacted]d 11:16 / 00:07 3380 g F Vag-Spont EPI  LIV  1 Term 10/17/18 [redacted]w[redacted]d 11:32 / 00:06 2715 g F Vag-Spont Local  LIV   Past Surgical History:  Procedure Laterality Date   TONSILLECTOMY     Family History  Problem Relation Age of Onset   Breast cancer Mother    Breast cancer Maternal Grandmother    Social History   Tobacco Use   Smoking status: Former Smoker    Types: Cigars    Quit date: 01/13/2018    Years since quitting: 2.2   Smokeless tobacco: Never Used  Vaping Use   Vaping Use: Never used  Substance Use Topics   Alcohol use: Yes   Drug use: No   No Known Allergies Medications Prior to  Admission  Medication Sig Dispense Refill Last Dose   ibuprofen (ADVIL) 600 MG tablet Take 1 tablet (600 mg total) by mouth every 6 (six) hours. 30 tablet 0 04/29/2020 at Unknown time   potassium chloride SA (KLOR-CON) 20 MEQ tablet Take 2 tablets (40 mEq total) by mouth daily. 10 tablet 0    Prenatal Vit-Fe Fumarate-FA (PRENATAL PO) Take 1 tablet by mouth daily.      I have reviewed patient's Past Medical Hx, Surgical Hx, Family Hx, Social Hx, medications and allergies.   ROS:  Review of Systems  Constitutional: Positive for chills and fatigue.  HENT: Negative.   Respiratory: Negative.   Cardiovascular: Negative.   Gastrointestinal: Negative.   Genitourinary: Negative.   Musculoskeletal: Negative.   Neurological: Positive for headaches.  Psychiatric/Behavioral: Negative.     Physical Exam   Patient Vitals for the past 24 hrs:  BP Temp Pulse Resp SpO2 Weight  04/29/20 1638 130/60 -- 89 -- -- --  04/29/20 1546 119/60 -- 93 -- -- --  04/29/20 1543 -- 99.1 F (37.3 C) -- -- -- --  04/29/20 1530 (!) 128/101 -- 97 -- -- --  04/29/20 1520 (!) 143/73 -- (!) 102 -- -- --  04/29/20 1416 139/75 -- (!) 101 -- -- --  04/29/20 1414 -- 05/01/20  101.9 F (38.8 C) -- 12 99 % --  04/29/20 1403 -- -- -- -- -- 65.8 kg   Constitutional: well-developed, well-nourished female in no acute distress.  Cardiovascular: normal rate Respiratory: normal effort Breasts: Right: approximately 3cmx3cm tender lump on RLQ, pencil-erase sized nontender lump LLQ, no erythema, Left: no palpable lumps or masses, no erythema GI: abd soft, non-tender MS: extremities nontender, no edema, normal ROM Neurologic: alert and oriented x 4.  GU: neg CVAT. Pelvic: deferred   Labs: Results for orders placed or performed during the hospital encounter of 04/29/20 (from the past 24 hour(s))  CBC     Status: Abnormal   Collection Time: 04/29/20  2:50 PM  Result Value Ref Range   WBC 22.6 (H) 4.0 - 10.5 K/uL   RBC 4.66 3.87 -  5.11 MIL/uL   Hemoglobin 11.2 (L) 12.0 - 15.0 g/dL   HCT 03.5 (L) 46.5 - 68.1 %   MCV 73.6 (L) 80.0 - 100.0 fL   MCH 24.0 (L) 26.0 - 34.0 pg   MCHC 32.7 30.0 - 36.0 g/dL   RDW 27.5 (H) 17.0 - 01.7 %   Platelets 466 (H) 150 - 400 K/uL   nRBC 0.0 0.0 - 0.2 %  Comprehensive metabolic panel     Status: Abnormal   Collection Time: 04/29/20  2:50 PM  Result Value Ref Range   Sodium 134 (L) 135 - 145 mmol/L   Potassium 2.9 (L) 3.5 - 5.1 mmol/L   Chloride 101 98 - 111 mmol/L   CO2 24 22 - 32 mmol/L   Glucose, Bld 95 70 - 99 mg/dL   BUN <5 (L) 6 - 20 mg/dL   Creatinine, Ser 4.94 0.44 - 1.00 mg/dL   Calcium 8.4 (L) 8.9 - 10.3 mg/dL   Total Protein 7.4 6.5 - 8.1 g/dL   Albumin 3.1 (L) 3.5 - 5.0 g/dL   AST 12 (L) 15 - 41 U/L   ALT 12 0 - 44 U/L   Alkaline Phosphatase 107 38 - 126 U/L   Total Bilirubin 0.6 0.3 - 1.2 mg/dL   GFR, Estimated >49 >67 mL/min   Anion gap 9 5 - 15  Urinalysis, Routine w reflex microscopic     Status: Abnormal   Collection Time: 04/29/20  3:00 PM  Result Value Ref Range   Color, Urine STRAW (A) YELLOW   APPearance CLEAR CLEAR   Specific Gravity, Urine 1.002 (L) 1.005 - 1.030   pH 7.0 5.0 - 8.0   Glucose, UA NEGATIVE NEGATIVE mg/dL   Hgb urine dipstick SMALL (A) NEGATIVE   Bilirubin Urine NEGATIVE NEGATIVE   Ketones, ur NEGATIVE NEGATIVE mg/dL   Protein, ur NEGATIVE NEGATIVE mg/dL   Nitrite NEGATIVE NEGATIVE   Leukocytes,Ua NEGATIVE NEGATIVE   WBC, UA 0-5 0 - 5 WBC/hpf   Bacteria, UA NONE SEEN NONE SEEN    Imaging:  No results found.  MAU Course: Orders Placed This Encounter  Procedures   OB Urine Culture   CBC   Comprehensive metabolic panel   Urinalysis, Routine w reflex microscopic   Discharge patient   Meds ordered this encounter  Medications   acetaminophen (TYLENOL) tablet 1,000 mg   dicloxacillin (DYNAPEN) 500 MG capsule    Sig: Take 1 capsule (500 mg total) by mouth 4 (four) times daily for 14 days.    Dispense:  56 capsule     Refill:  0    Order Specific Question:   Supervising Provider    Answer:  Reva Bores [[id number]]    MDM: CBC with elevated white count, otherwise unremarkable CMP wnl UA with small amount of blood. No urinary s/s, however will order urine culture given fever PO Tylenol for fever and headache. Temp down to 99.1 Pt reports headache has resolved after Tylenol and sleeping  Symptoms and fever likely related to mastitis given HPI and PE. Will rx dicloxacillin 500mg  QID x14 days Recommend continue pumping, massaging affected area, warm compresses, and avoid tight fitting clothing Follow up as needed in office or MAU for emergencies   Assessment: 1. Mastitis    Plan: Discharge home in stable condition.  Follow up as needed in office or MAU for emergencies     Allergies as of 04/29/2020   No Known Allergies      Medication List     TAKE these medications    dicloxacillin 500 MG capsule Commonly known as: DYNAPEN Take 1 capsule (500 mg total) by mouth 4 (four) times daily for 14 days.   ibuprofen 600 MG tablet Commonly known as: ADVIL Take 1 tablet (600 mg total) by mouth every 6 (six) hours.   potassium chloride SA 20 MEQ tablet Commonly known as: KLOR-CON Take 2 tablets (40 mEq total) by mouth daily.   PRENATAL PO Take 1 tablet by mouth daily.        05/01/2020, CNM 04/29/2020 2:44 PM

## 2020-04-29 NOTE — MAU Note (Signed)
Pt reports 2-3 days ago her right boob started hurting really bad with sharp pain.   Pt reports that she could feel a lump in her right breast and kept pumping.   Pt reports yesterday at 1900 she got a headache and started feeling cold.   Reports having feelings of feeling super hot then super cold.   Pt reports that she think this has to do with her lump in her breast.   Pt reports the lump is getting bigger.   Pt reports vaginal delivery on April 2nd.

## 2020-04-29 NOTE — Discharge Instructions (Signed)
Mastitis  Mastitis is inflammation of the breast tissue. It occurs most often in women who are breastfeeding, but it can also affect other women, and sometimes even men. What are the causes? This condition is usually caused by a bacterial infection. Bacteria enter the breast tissue through cuts or openings in the skin. Typically, this occurs with breastfeeding because of cracked or irritated nipples. Sometimes, it can occur when there is no opening in the skin. This is usually caused by plugged milk ducts. Other causes include:  Nipple piercing.  Some forms of breast cancer. What are the signs or symptoms? Symptoms of this condition include:  Swelling, redness, tenderness, and pain in an area of the breast. The area may also feel warm to the touch. These symptoms usually affect the upper part of the breast, toward the armpit region.  Swelling of the glands under the arm on the same side.  Fever.  Rapid pulse.  Fatigue, headache, and flu-like muscle aches. If an infection is allowed to progress, a collection of pus (abscess) may develop. How is this diagnosed? This condition can usually be diagnosed based on a physical exam and your symptoms. You may also have other tests, such as:  Blood tests to determine if your body is fighting a bacterial infection.  Mammogram or ultrasound tests to rule out other problems or diseases.  Testing of pus and other fluids. Pus from the breast may be collected and examined in the lab. If an abscess has developed, the fluid in the abscess can be removed with a needle. This test can be used to confirm the diagnosis and identify the bacteria present.  If you are breastfeeding, breast milk may be cultured and tested for bacteria. How is this treated? Treatment for this condition may include:  Applying heat or cold compresses to the affected area.  Medicine for pain.  Antibiotic medicine to treat a bacterial infection. This is usually taken by  mouth.  Self-care such as rest and increased fluid intake.  If an abscess has developed, it may be treated by removing fluid with a needle. Mastitis that occurs with breastfeeding will sometimes go away on its own, so your health care provider may choose to wait 24 hours after first seeing you to decide whether a prescription medicine is needed. You may be told of different ways to help manage breastfeeding, such as continuing to breastfeed or pump in order to ensure adequate milk flow. Follow these instructions at home: Medicines  Take over-the-counter and prescription medicines only as told by your health care provider.  If you were prescribed an antibiotic medicine, take it as told by your health care provider. Do not stop taking the antibiotic even if you start to feel better. General instructions  Do not wear a tight or underwire bra. Wear a soft, supportive bra.  Increase your fluid intake, especially if you have a fever.  Get plenty of rest. If you are breastfeeding:  Continue to empty your breasts as often as possible either by breastfeeding or by using a breast pump. This will decrease the pressure and the pain that comes with it. Ask your health care provider if changes need to be made to your breastfeeding or pumping routine.  Keep your nipples clean and dry.  During breastfeeding, empty the first breast completely before going to the other breast. If your baby is not emptying your breasts completely, use a breast pump to empty your breasts.  Use breast massage during feeding or pumping sessions.    If directed, apply moist heat to the affected area of your breast right before breastfeeding or pumping. Use the heat source that your health care provider recommends.  If directed, put ice on the affected area of your breast right after breastfeeding or pumping: ? Put ice in a plastic bag. ? Place a towel between your skin and the bag. ? Leave the ice on for 20 minutes.  If  you go back to work, pump your breasts while at work to stay within your nursing schedule.  Avoid allowing your breasts to become overly filled with milk (engorged). Contact a health care provider if:  You have pus-like discharge from the breast.  You have a fever.  Your symptoms do not improve within 2 days of starting treatment. Get help right away if:  Your pain and swelling are getting worse.  You have pain that is not controlled with medicine.  You have a red line extending from the breast toward your armpit. Summary  Mastitis is inflammation of the breast tissue. It occurs most often in women who are breastfeeding, but it can also affect non-breastfeeding women and some men.  This condition is usually caused by a bacterial infection.  This condition may be treated with hot and cold compresses, medicines, self-care, and certain breastfeeding strategies.  If you were prescribed an antibiotic medicine, take it as told by your health care provider. Do not stop taking the antibiotic even if you start to feel better. This information is not intended to replace advice given to you by your health care provider. Make sure you discuss any questions you have with your health care provider. Document Revised: 12/12/2016 Document Reviewed: 01/22/2016 Elsevier Patient Education  2021 [employer] Inc.  

## 2020-04-30 LAB — CULTURE, OB URINE: Culture: <10000 — AB

## 2020-10-28 IMAGING — US LIMITED OBSTETRIC ULTRASOUND
1 series · 14 of 28 positions shown · non-contrast
Comparison: none

CLINICAL DATA: 18-year-old pregnant female with vaginal bleeding
and pelvic pain for 2 days.

EXAM:
LIMITED OBSTETRIC ULTRASOUND

[Series 1: limited obstetric ultrasound · 14 of 68 slices shown]
[im 3/68]
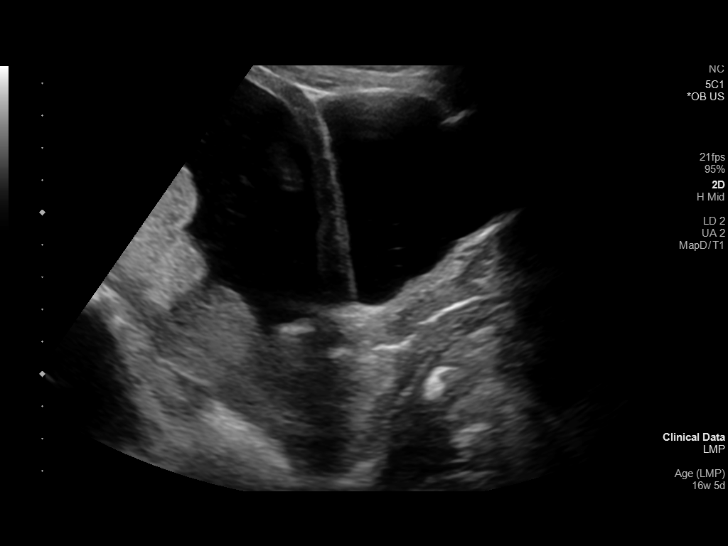
[im 8/68]
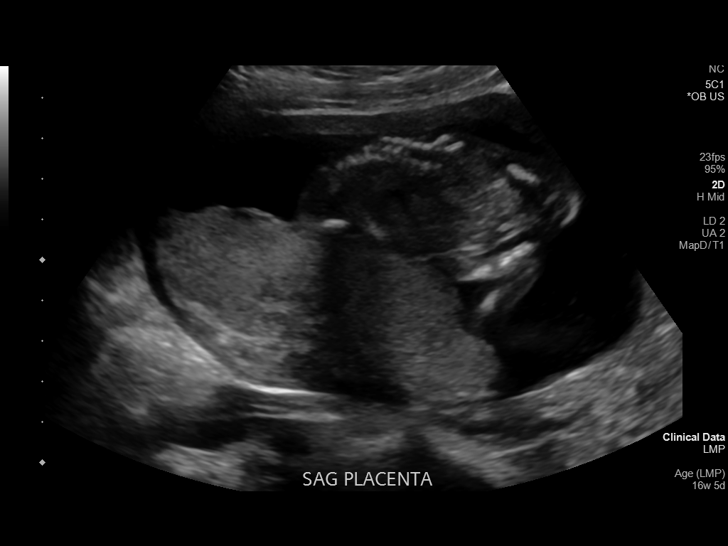
[im 13/68]
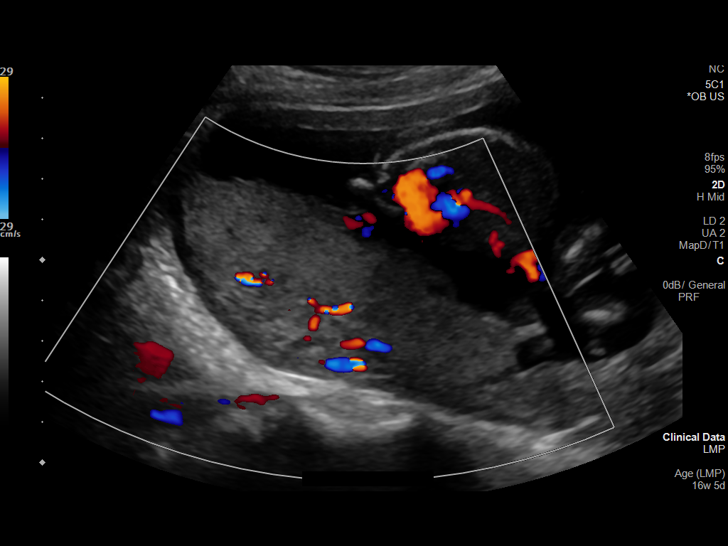
[im 18/68]
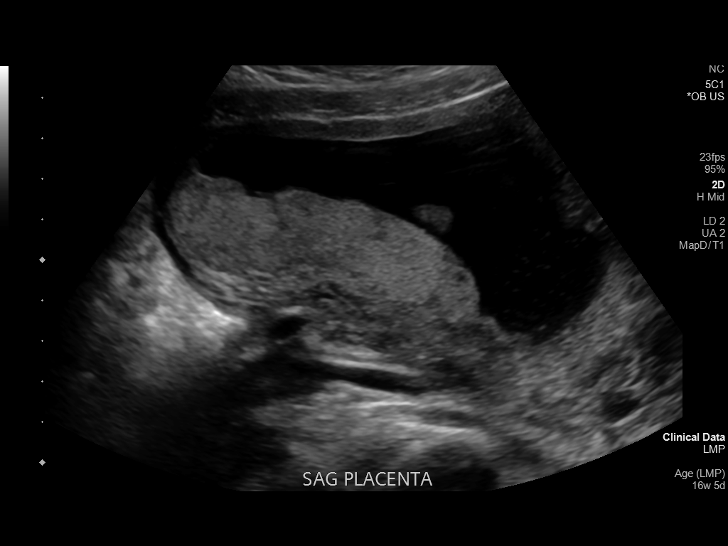
[im 23/68]
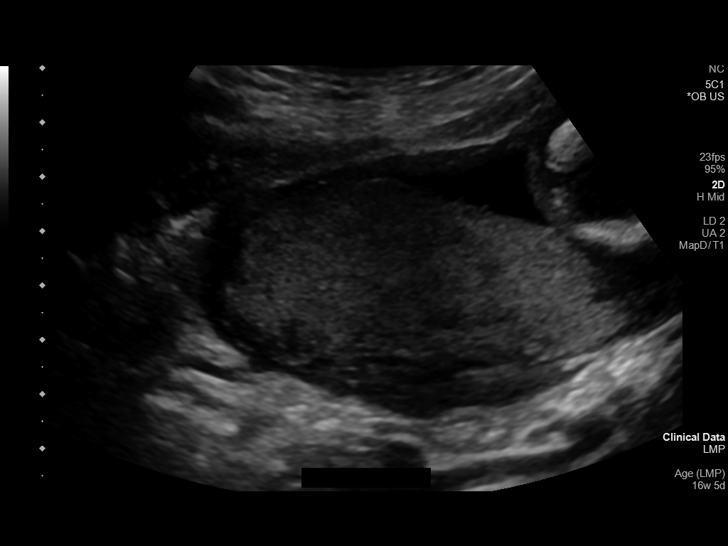
[im 28/68]
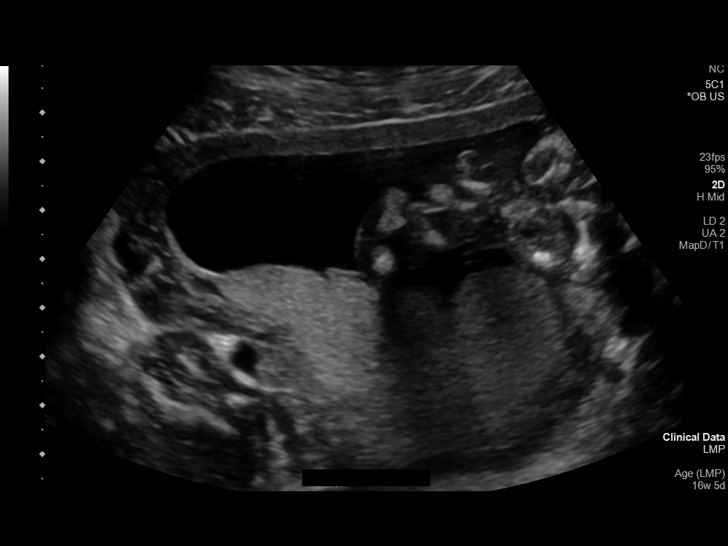
[im 33/68]
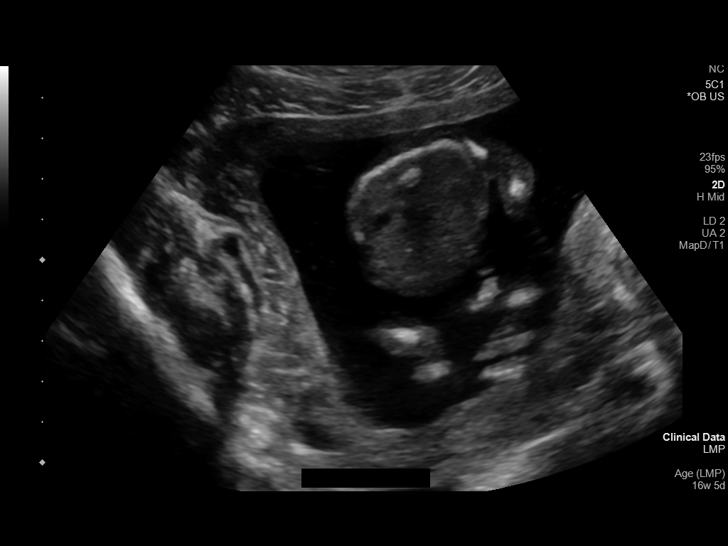
[im 38/68]
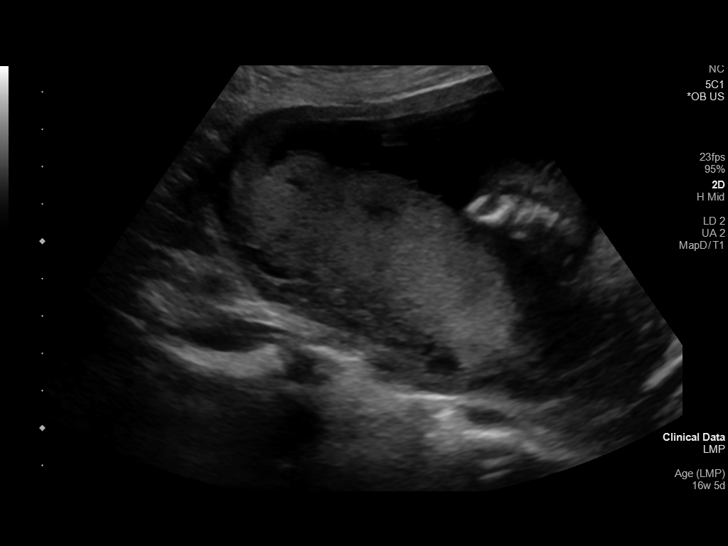
[im 43/68]
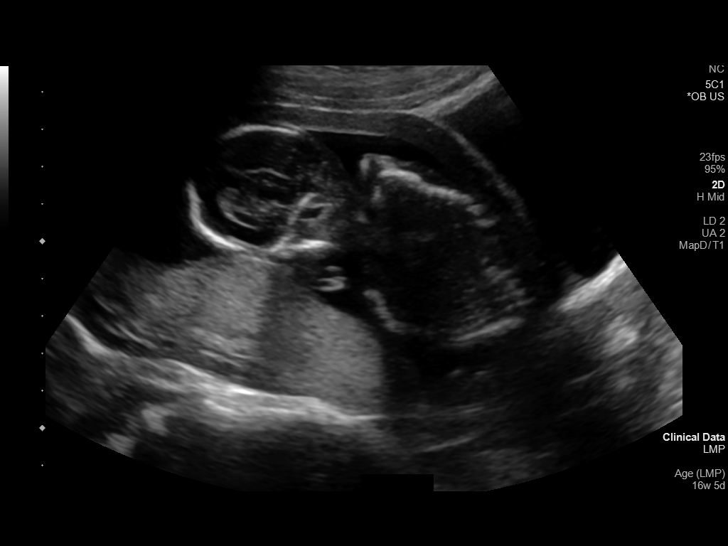
[im 48/68]
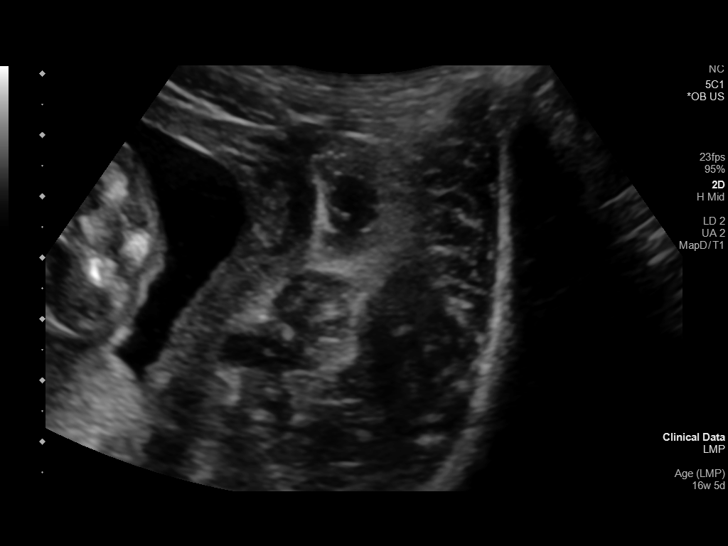
[im 53/68]
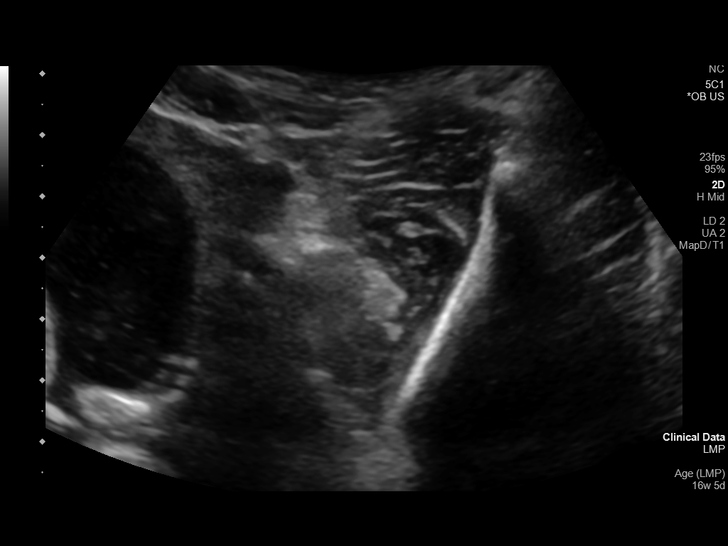
[im 58/68]
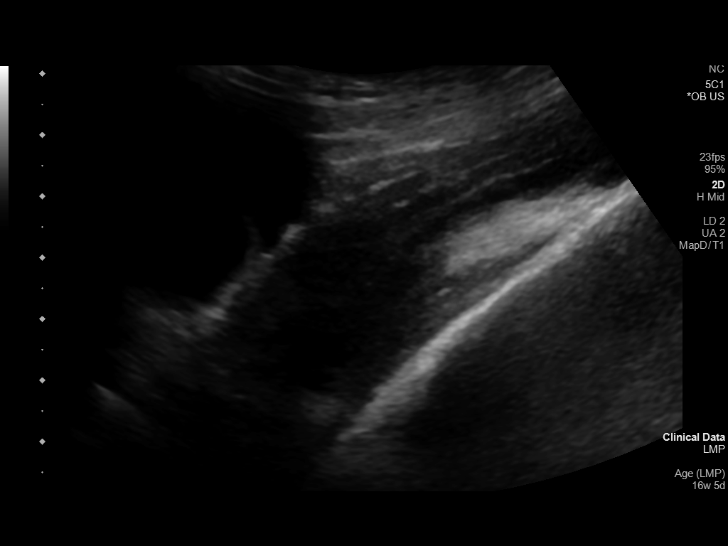
[im 63/68]
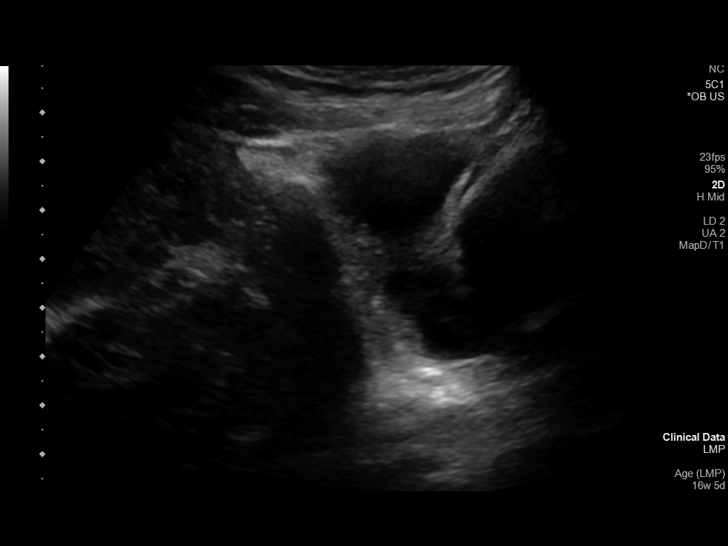
[im 68/68]
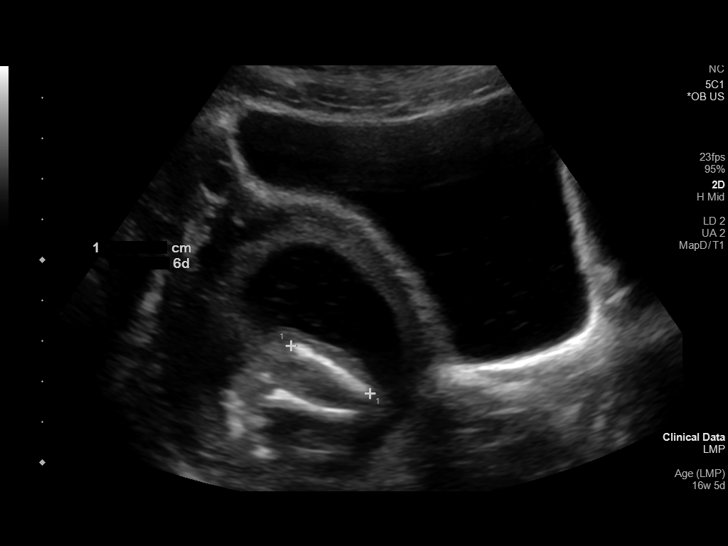

[14 of 28 positions shown; findings below may reference images not displayed]

FINDINGS: Number of Fetuses: 1

Heart Rate:  154 bpm

Movement: Yes

Presentation: Breech

Placental Location: Posterior

Previa: No

Amniotic Fluid (Subjective):  Within normal limits.

BPD: 3.4 cm 16 w  4 d

MATERNAL FINDINGS:

Cervix:  Appears closed.

Uterus/Adnexae: Ovaries not visualized. No adnexal mass or free
fluid.
IMPRESSION: Single living intrauterine gestation with estimated gestational age
of 16 weeks 4 days by this ultrasound.

No acute abnormality identified.

This exam is performed on an emergent basis and does not
comprehensively evaluate fetal size, dating, or anatomy; follow-up
complete OB US should be considered if further fetal assessment is
warranted.

## 2020-12-07 ENCOUNTER — Encounter (HOSPITAL_BASED_OUTPATIENT_CLINIC_OR_DEPARTMENT_OTHER): Payer: Self-pay | Admitting: *Deleted

## 2020-12-07 ENCOUNTER — Emergency Department (HOSPITAL_BASED_OUTPATIENT_CLINIC_OR_DEPARTMENT_OTHER)
Admission: EM | Admit: 2020-12-07 | Discharge: 2020-12-07 | Disposition: A | Discharge status: Home / Self Care | Payer: BC Managed Care – PPO | Attending: Emergency Medicine | Admitting: Emergency Medicine

## 2020-12-07 ENCOUNTER — Other Ambulatory Visit: Payer: Self-pay

## 2020-12-07 DIAGNOSIS — Z5321 Procedure and treatment not carried out due to patient leaving prior to being seen by health care provider: Secondary | ICD-10-CM | POA: Diagnosis not present

## 2020-12-07 DIAGNOSIS — R1030 Lower abdominal pain, unspecified: Secondary | ICD-10-CM | POA: Insufficient documentation

## 2020-12-07 DIAGNOSIS — R350 Frequency of micturition: Secondary | ICD-10-CM | POA: Diagnosis not present

## 2020-12-07 LAB — URINALYSIS, ROUTINE W REFLEX MICROSCOPIC
Bilirubin Urine: NEGATIVE
Glucose, UA: NEGATIVE mg/dL
Hgb urine dipstick: NEGATIVE
Ketones, ur: NEGATIVE mg/dL
Leukocytes,Ua: NEGATIVE
Nitrite: NEGATIVE
Protein, ur: NEGATIVE mg/dL
Specific Gravity, Urine: 1.01 (ref 1.005–1.030)
pH: 6.5 (ref 5.0–8.0)

## 2020-12-07 LAB — COMPREHENSIVE METABOLIC PANEL
ALT: 11 U/L (ref 0–44)
AST: 13 U/L — ABNORMAL LOW (ref 15–41)
Albumin: 4.5 g/dL (ref 3.5–5.0)
Alkaline Phosphatase: 70 U/L (ref 38–126)
Anion gap: 9 (ref 5–15)
BUN: 12 mg/dL (ref 6–20)
CO2: 23 mmol/L (ref 22–32)
Calcium: 10 mg/dL (ref 8.9–10.3)
Chloride: 107 mmol/L (ref 98–111)
Creatinine, Ser: 0.7 mg/dL (ref 0.44–1.00)
GFR, Estimated: >60 mL/min (ref >60)
Glucose, Bld: 111 mg/dL — ABNORMAL HIGH (ref 70–99)
Potassium: 3.6 mmol/L (ref 3.5–5.1)
Sodium: 139 mmol/L (ref 135–145)
Total Bilirubin: 0.3 mg/dL (ref 0.3–1.2)
Total Protein: 7.3 g/dL (ref 6.5–8.1)

## 2020-12-07 LAB — LIPASE, BLOOD: Lipase: 46 U/L (ref 11–51)

## 2020-12-07 LAB — CBC
HCT: 35.5 % — ABNORMAL LOW (ref 36.0–46.0)
Hemoglobin: 12 g/dL (ref 12.0–15.0)
MCH: 28.2 pg (ref 26.0–34.0)
MCHC: 33.8 g/dL (ref 30.0–36.0)
MCV: 83.5 fL (ref 80.0–100.0)
Platelets: 320 10*3/uL (ref 150–400)
RBC: 4.25 MIL/uL (ref 3.87–5.11)
RDW: 15 % (ref 11.5–15.5)
WBC: 6.3 10*3/uL (ref 4.0–10.5)
nRBC: 0 % (ref 0.0–0.2)

## 2020-12-07 LAB — PREGNANCY, URINE: Preg Test, Ur: NEGATIVE

## 2020-12-07 NOTE — ED Triage Notes (Signed)
Pt reports lower abdominal pain x 1 week. Reports more frequent urination. Denies dysuria. Denies vaginal discharge. Denies n/v/d

## 2020-12-11 ENCOUNTER — Emergency Department (HOSPITAL_BASED_OUTPATIENT_CLINIC_OR_DEPARTMENT_OTHER)
Admission: EM | Admit: 2020-12-11 | Discharge: 2020-12-11 | Disposition: A | Discharge status: Home / Self Care | Payer: BC Managed Care – PPO | Attending: Emergency Medicine | Admitting: Emergency Medicine

## 2020-12-11 ENCOUNTER — Encounter (HOSPITAL_BASED_OUTPATIENT_CLINIC_OR_DEPARTMENT_OTHER): Payer: Self-pay | Admitting: Emergency Medicine

## 2020-12-11 ENCOUNTER — Other Ambulatory Visit: Payer: Self-pay

## 2020-12-11 DIAGNOSIS — Z20822 Contact with and (suspected) exposure to covid-19: Secondary | ICD-10-CM | POA: Diagnosis not present

## 2020-12-11 DIAGNOSIS — F17290 Nicotine dependence, other tobacco product, uncomplicated: Secondary | ICD-10-CM | POA: Diagnosis not present

## 2020-12-11 DIAGNOSIS — R109 Unspecified abdominal pain: Secondary | ICD-10-CM | POA: Diagnosis present

## 2020-12-11 DIAGNOSIS — B9689 Other specified bacterial agents as the cause of diseases classified elsewhere: Secondary | ICD-10-CM | POA: Insufficient documentation

## 2020-12-11 DIAGNOSIS — N760 Acute vaginitis: Secondary | ICD-10-CM | POA: Diagnosis not present

## 2020-12-11 LAB — URINALYSIS, ROUTINE W REFLEX MICROSCOPIC
Bilirubin Urine: NEGATIVE
Glucose, UA: NEGATIVE mg/dL
Ketones, ur: NEGATIVE mg/dL
Leukocytes,Ua: NEGATIVE
Nitrite: NEGATIVE
Protein, ur: NEGATIVE mg/dL
Specific Gravity, Urine: 1.012 (ref 1.005–1.030)
pH: 6.5 (ref 5.0–8.0)

## 2020-12-11 LAB — COMPREHENSIVE METABOLIC PANEL
ALT: 11 U/L (ref 0–44)
AST: 11 U/L — ABNORMAL LOW (ref 15–41)
Albumin: 4.6 g/dL (ref 3.5–5.0)
Alkaline Phosphatase: 65 U/L (ref 38–126)
Anion gap: 8 (ref 5–15)
BUN: 8 mg/dL (ref 6–20)
CO2: 26 mmol/L (ref 22–32)
Calcium: 9.8 mg/dL (ref 8.9–10.3)
Chloride: 106 mmol/L (ref 98–111)
Creatinine, Ser: 0.58 mg/dL (ref 0.44–1.00)
GFR, Estimated: >60 mL/min (ref >60)
Glucose, Bld: 102 mg/dL — ABNORMAL HIGH (ref 70–99)
Potassium: 3.6 mmol/L (ref 3.5–5.1)
Sodium: 140 mmol/L (ref 135–145)
Total Bilirubin: 0.7 mg/dL (ref 0.3–1.2)
Total Protein: 7.6 g/dL (ref 6.5–8.1)

## 2020-12-11 LAB — DIFFERENTIAL
Abs Immature Granulocytes: 0.02 10*3/uL (ref 0.00–0.07)
Basophils Absolute: 0 10*3/uL (ref 0.0–0.1)
Basophils Relative: 0 %
Eosinophils Absolute: 0.2 10*3/uL (ref 0.0–0.5)
Eosinophils Relative: 2 %
Immature Granulocytes: 0 %
Lymphocytes Relative: 11 %
Lymphs Abs: 1.1 10*3/uL (ref 0.7–4.0)
Monocytes Absolute: 0.8 10*3/uL (ref 0.1–1.0)
Monocytes Relative: 8 %
Neutro Abs: 7.5 10*3/uL (ref 1.7–7.7)
Neutrophils Relative %: 79 %

## 2020-12-11 LAB — CBC
HCT: 37.6 % (ref 36.0–46.0)
Hemoglobin: 12.4 g/dL (ref 12.0–15.0)
MCH: 28.1 pg (ref 26.0–34.0)
MCHC: 33 g/dL (ref 30.0–36.0)
MCV: 85.1 fL (ref 80.0–100.0)
Platelets: 316 10*3/uL (ref 150–400)
RBC: 4.42 MIL/uL (ref 3.87–5.11)
RDW: 15.3 % (ref 11.5–15.5)
WBC: 9.6 10*3/uL (ref 4.0–10.5)
nRBC: 0 % (ref 0.0–0.2)

## 2020-12-11 LAB — MAGNESIUM: Magnesium: 2 mg/dL (ref 1.7–2.4)

## 2020-12-11 LAB — WET PREP, GENITAL
Sperm: NONE SEEN
Trich, Wet Prep: NONE SEEN
WBC, Wet Prep HPF POC: >=10 — AB (ref <10)
Yeast Wet Prep HPF POC: NONE SEEN

## 2020-12-11 LAB — PREGNANCY, URINE: Preg Test, Ur: NEGATIVE

## 2020-12-11 LAB — RESP PANEL BY RT-PCR (FLU A&B, COVID) ARPGX2
Influenza A by PCR: NEGATIVE
Influenza B by PCR: NEGATIVE
SARS Coronavirus 2 by RT PCR: NEGATIVE

## 2020-12-11 LAB — LIPASE, BLOOD: Lipase: 19 U/L (ref 11–51)

## 2020-12-11 MED ORDER — METRONIDAZOLE 500 MG PO TABS: 500.0000 mg | ORAL_TABLET | Freq: Two times a day (BID) | ORAL | 0 refills | Status: AC

## 2020-12-11 NOTE — ED Notes (Signed)
RN provided AVS using Teachback Method. Patient verbalizes understanding of Discharge Instructions. Opportunity for Questioning and Answers were provided by RN. Patient Discharged from ED ambulatory to Home via Self.  

## 2020-12-11 NOTE — ED Notes (Signed)
Pelvic exam done by Dr. Durwin Nora and Kenney Houseman - EMT assisted.

## 2020-12-11 NOTE — ED Notes (Signed)
Pt aware urine specimen ordered. Pt reports inability to provide specimen at this time. Specimen collection device provided to patient. 

## 2020-12-11 NOTE — ED Triage Notes (Signed)
Pt arrives to ED with c/o lower abdominal pain. This pain started x1.5 ago. The pain is dull and intermittent. No N/V/D. She does report left sided flank pain. No dysuria.

## 2020-12-11 NOTE — ED Notes (Signed)
EDP at Bedside 

## 2020-12-11 NOTE — ED Notes (Signed)
ED Provider at bedside. 

## 2020-12-11 NOTE — ED Provider Notes (Signed)
[facility] EMERGENCY DEPT Provider Note   CSN: [id number] Arrival date & time: 12/11/20  6063     History Chief Complaint  Patient presents with   Abdominal Pain    Julie Randall is a 20 y.o. female.   Abdominal Pain Associated symptoms: constipation, cough, sore throat and vaginal discharge (yellow; noticed yesterday)   Associated symptoms: no chest pain, no chills, no diarrhea, no dysuria, no fatigue, no fever, no hematuria, no nausea, no shortness of breath and no vomiting   Patient presents for URI symptoms for the past 2 days.  Additionally, she has had lower abdominal pain that has been intermittent over the past 1.5 weeks.  Pain was previously radiating to her left flank.  Currently, she has some pain in her left lower back.  She has not had any known fevers.  She has not had nausea or vomiting.  She denies any dysuria.  She presents to the ED with her daughter, who also has had URI symptoms for the past 2 days.  Over the holiday weekend, they were around family who were recently diagnosed with flu.    Past Medical History:  Diagnosis Date   Sickle cell trait [facility])     Patient Active Problem List   Diagnosis Date Noted   Gestational hypertension 04/13/2020   PROM with onset of labor within 24 hours of rupture 10/17/2018   SVD (spontaneous vaginal delivery) 10/17/2018    Past Surgical History:  Procedure Laterality Date   TONSILLECTOMY       OB History     Gravida  2   Para  2   Term  2   Preterm  0   AB  0   Living  2      SAB  0   IAB  0   Ectopic  0   Multiple  0   Live Births  2           Family History  Problem Relation Age of Onset   Breast cancer Mother    Breast cancer Maternal Grandmother     Social History   Tobacco Use   Smoking status: Some Days    Types: Cigars    Last attempt to quit: 01/13/2018    Years since quitting: 2.9   Smokeless tobacco: Never  Vaping Use   Vaping Use: Never used   Substance Use Topics   Alcohol use: Yes   Drug use: No    Home Medications Prior to Admission medications   Medication Sig Start Date End Date Taking? Authorizing Provider  metroNIDAZOLE (FLAGYL) 500 MG tablet Take 1 tablet (500 mg total) by mouth 2 (two) times daily. 12/11/20  Yes Gloris Manchester, MD  ibuprofen (ADVIL) 600 MG tablet Take 1 tablet (600 mg total) by mouth every 6 (six) hours. 04/15/20   Meisinger, Todd, MD  potassium chloride SA (KLOR-CON) 20 MEQ tablet Take 2 tablets (40 mEq total) by mouth daily. 11/06/18   Molpus, John, MD  Prenatal Vit-Fe Fumarate-FA (PRENATAL PO) Take 1 tablet by mouth daily.    [provider]    Allergies    Patient has no known allergies.  Review of Systems   Review of Systems  Constitutional:  Negative for activity change, appetite change, chills, fatigue and fever.  HENT:  Positive for congestion, rhinorrhea, sinus pressure and sore throat. Negative for ear pain, trouble swallowing and voice change.   Eyes:  Negative for pain and visual disturbance.  Respiratory:  Positive  for cough. Negative for chest tightness, shortness of breath and wheezing.   Cardiovascular:  Negative for chest pain and palpitations.  Gastrointestinal:  Positive for abdominal pain and constipation. Negative for diarrhea, nausea and vomiting.       Last BM: 4 days ago.  Genitourinary:  Positive for vaginal discharge (yellow; noticed yesterday). Negative for dysuria, flank pain, hematuria, menstrual problem, pelvic pain and urgency.       Nexplanon in June. LMP 2 months ago.  Musculoskeletal:  Positive for back pain (L lower back). Negative for arthralgias, gait problem, myalgias and neck pain.  Skin:  Negative for color change and rash.  Neurological:  Positive for headaches. Negative for dizziness, seizures, syncope, weakness and numbness.  All other systems reviewed and are negative.  Physical Exam Updated Vital Signs BP 135/87 (BP Location: Left Arm)   Pulse  83   Temp 98.2 F (36.8 C) (Oral)   Resp 18   SpO2 100%   Physical Exam Vitals and nursing note reviewed. Exam conducted with a chaperone present.  Constitutional:      General: She is not in acute distress.    Appearance: She is well-developed and normal weight. She is not ill-appearing, toxic-appearing or diaphoretic.  HENT:     Head: Normocephalic and atraumatic.     Mouth/Throat:     Mouth: Mucous membranes are moist.     Pharynx: Oropharynx is clear.  Eyes:     General: No scleral icterus.    Conjunctiva/sclera: Conjunctivae normal.     Pupils: Pupils are equal, round, and reactive to light.  Cardiovascular:     Rate and Rhythm: Normal rate and regular rhythm.     Heart sounds: No murmur heard. Pulmonary:     Effort: Pulmonary effort is normal. No respiratory distress.     Breath sounds: Normal breath sounds. No wheezing or rales.  Abdominal:     Palpations: Abdomen is soft.     Tenderness: There is no abdominal tenderness. There is no right CVA tenderness or left CVA tenderness.  Genitourinary:    Exam position: Supine.     Pubic Area: No rash.      Vagina: No foreign body. Vaginal discharge present. No erythema or bleeding.     Cervix: No cervical motion tenderness, friability or cervical bleeding.  Musculoskeletal:        General: No swelling.     Cervical back: Neck supple.  Skin:    General: Skin is warm and dry.     Capillary Refill: Capillary refill takes less than 2 seconds.     Coloration: Skin is not cyanotic or jaundiced.  Neurological:     General: No focal deficit present.     Mental Status: She is alert and oriented to person, place, and time.     Cranial Nerves: No cranial nerve deficit.     Motor: No weakness.  Psychiatric:        Mood and Affect: Mood normal. Mood is not anxious or depressed.        Behavior: Behavior normal.    ED Results / Procedures / Treatments   Labs (all labs ordered are listed, but only abnormal results are  displayed) Labs Reviewed  WET PREP, GENITAL - Abnormal; Notable for the following components:      Result Value   Clue Cells Wet Prep HPF POC PRESENT (*)    WBC, Wet Prep HPF POC >=10 (*)    All other components within normal limits  URINALYSIS,  ROUTINE W REFLEX MICROSCOPIC - Abnormal; Notable for the following components:   Hgb urine dipstick TRACE (*)    Bacteria, UA RARE (*)    All other components within normal limits  COMPREHENSIVE METABOLIC PANEL - Abnormal; Notable for the following components:   Glucose, Bld 102 (*)    AST 11 (*)    All other components within normal limits  RESP PANEL BY RT-PCR (FLU A&B, COVID) ARPGX2  PREGNANCY, URINE  CBC  DIFFERENTIAL  LIPASE, BLOOD  MAGNESIUM  CBC WITH DIFFERENTIAL/PLATELET  GC/CHLAMYDIA PROBE AMP ([place]) NOT AT [facility]    EKG None  Radiology No results found.  Procedures Procedures   Medications Ordered in ED Medications - No data to display  ED Course  I have reviewed the triage vital signs and the nursing notes.  Pertinent labs & imaging results that were available during my care of the patient were reviewed by me and considered in my medical decision making (see chart for details).    MDM Rules/Calculators/A&P                          Patient presents for URI symptoms over the past 2 days.  Additionally, she has had lower abdominal pain over the past week and a half that has been intermittent.  Currently, pain is midline.  She has a small area of pain on her left lower back.  She denies any other areas of discomfort.  On exam, she is well-appearing.  Vital signs are normal.  She has not had any known fevers at home.  There is recent lab work from a previous ED visit, during which she left prior to being seen.  Results of this lab work are reassuring.  Repeat lab work was performed here in the ED.  Urine showed no evidence of infection.  COVID and flu testing were negative.  Patient underwent pelvic exam with chaperone  present.  Results of wet prep showed BV.  Patient was prescribed Flagyl.  She was advised to not drink alcohol while taking this medication.  Patient was discharged in good condition.  Final Clinical Impression(s) / ED Diagnoses Final diagnoses:  Bacterial vaginosis    Rx / DC Orders ED Discharge Orders          Ordered    metroNIDAZOLE (FLAGYL) 500 MG tablet  2 times daily        12/11/20 1301             Godfrey Pick, MD 12/12/20 [phone]

## 2020-12-11 NOTE — Discharge Instructions (Signed)
A prescription for metronidazole was sent to [facility] on [address].  This is a 7-day prescription.  Take as prescribed.  While you are taking this, avoid alcohol use.  Follow-up with primary care doctor as needed.  If you do not have a primary care doctor, there is a number on this paperwork to call to establish one.

## 2020-12-12 LAB — GC/CHLAMYDIA PROBE AMP (~~LOC~~) NOT AT ARMC
Chlamydia: NEGATIVE
Comment: NEGATIVE
Comment: NORMAL
Neisseria Gonorrhea: NEGATIVE

## 2021-03-15 ENCOUNTER — Encounter (HOSPITAL_BASED_OUTPATIENT_CLINIC_OR_DEPARTMENT_OTHER): Payer: Self-pay

## 2021-03-15 ENCOUNTER — Other Ambulatory Visit: Payer: Self-pay

## 2021-03-15 ENCOUNTER — Emergency Department (HOSPITAL_BASED_OUTPATIENT_CLINIC_OR_DEPARTMENT_OTHER)
Admission: EM | Admit: 2021-03-15 | Discharge: 2021-03-15 | Disposition: A | Discharge status: Home / Self Care | Payer: Medicaid Other | Attending: Emergency Medicine | Admitting: Emergency Medicine

## 2021-03-15 DIAGNOSIS — Z114 Encounter for screening for human immunodeficiency virus [HIV]: Secondary | ICD-10-CM | POA: Diagnosis present

## 2021-03-15 LAB — RPR: RPR Ser Ql: NONREACTIVE

## 2021-03-15 LAB — RAPID HIV SCREEN (HIV 1/2 AB+AG)
HIV 1/2 Antibodies: NONREACTIVE
HIV-1 P24 Antigen - HIV24: NONREACTIVE

## 2021-03-15 NOTE — ED Provider Notes (Signed)
?[facility] EM[facility] DEPT ?Provider Note ? ? ?CSN: [id number] ?Arrival date & time: 03/15/21  6789 ? ?  ? ?History ? ?Chief Complaint  ?Patient presents with  ?? std check  ? ? ?Julie Randall is a 21 y.o. female. ? ?Patient presents to ER with chief complaint of concern for HIV.  She states that she last had sex on February 14 with her partner.  That partner informed her recently that they were HIV positive and that they were concerned that they may have contracted the disease from the patient.  Patient herself has no pain.  Has no discharge no symptoms.  She presents to the ER for HIV testing.  Denies any vaginal discharge or vaginal bleeding or abdominal pain. ? ? ?  ? ?Home Medications ?Prior to Admission medications   ?Medication Sig Start Date End Date Taking? Authorizing Provider  ?ibuprofen (ADVIL) 600 MG tablet Take 1 tablet (600 mg total) by mouth every 6 (six) hours. 04/15/20   Meisinger, Tawanna Cooler, MD  ?metroNIDAZOLE (FLAGYL) 500 MG tablet Take 1 tablet (500 mg total) by mouth 2 (two) times daily. 12/11/20   Gloris Manchester, MD  ?potassium chloride SA (KLOR-CON) 20 MEQ tablet Take 2 tablets (40 mEq total) by mouth daily. 11/06/18   Molpus, John, MD  ?Prenatal Vit-Fe Fumarate-FA (PRENATAL PO) Take 1 tablet by mouth daily.    [provider]  ?   ? ?Allergies    ?Patient has no known allergies.   ? ?Review of Systems   ?Review of Systems  ?Constitutional:  Negative for fever.  ?HENT:  Negative for ear pain.   ?Eyes:  Negative for pain.  ?Respiratory:  Negative for cough.   ?Cardiovascular:  Negative for chest pain.  ?Gastrointestinal:  Negative for abdominal pain.  ?Genitourinary:  Negative for flank pain.  ?Musculoskeletal:  Negative for back pain.  ?Skin:  Negative for rash.  ?Neurological:  Negative for headaches.  ? ?Physical Exam ?Updated Vital Signs ?BP 136/88 (BP Location: Right Arm)   Pulse 80   Temp 97.8 ?F (36.6 ?C) (Oral)   Resp 18   Ht 5\' 6"  (1.676 m)   Wt 63.5 kg   SpO2 100%    BMI 22.60 kg/m?  ?Physical Exam ?Constitutional:   ?   General: She is not in acute distress. ?   Appearance: Normal appearance.  ?HENT:  ?   Head: Normocephalic.  ?   Nose: Nose normal.  ?Eyes:  ?   Extraocular Movements: Extraocular movements intact.  ?Cardiovascular:  ?   Rate and Rhythm: Normal rate.  ?Pulmonary:  ?   Effort: Pulmonary effort is normal.  ?Abdominal:  ?   Tenderness: There is no abdominal tenderness. There is no guarding or rebound.  ?Musculoskeletal:     ?   General: Normal range of motion.  ?   Cervical back: Normal range of motion.  ?Neurological:  ?   General: No focal deficit present.  ?   Mental Status: She is alert. Mental status is at baseline.  ? ? ?ED Results / Procedures / Treatments   ?Labs ?(all labs ordered are listed, but only abnormal results are displayed) ?Labs Reviewed  ?RPR  ?RAPID HIV SCREEN (HIV 1/2 AB+AG)  ?GC/CHLAMYDIA PROBE AMP ([facility]) NOT AT [facility]  ? ? ?EKG ?None ? ?Radiology ?No results found. ? ?Procedures ?Procedures  ? ? ?Medications Ordered in ED ?Medications - No data to display ? ?ED Course/ Medical Decision Making/ A&P ?Clinical Course as of 03/15/21  8101  ?Fri Mar 15, 2021  ?0744 GC/Chlamydia probe amp [facility]) not at [facility] [[facility]]  ?  ?Clinical Course User Index ?[[facility]] Audley Hose Eustace Moore, MD  ? ?                        ?Medical Decision Making ?Amount and/or Complexity of Data Reviewed ?Labs: ordered. ? ? ?Review of records shows ER visit in November 22 for bacterial vaginosis. ? ?Vital signs within normal limits. ? ?Exam is benign patient has no symptoms. ? ?HIV screening test ordered as well as RPR and gonorrhea chlamydia tests. ? ?Patient declined to wait for results.  She prefers to follow-up on her MyChart as she has worked this morning.  I advised her to return if she has any additional concerns, advised no intercourse until her test results return negative, advised outpatient follow-up with her primary care doctor as seroconversion for HIV may take  several mo weeks weeks.   ? ? ? ? ? ? ? ?Final Clinical Impression(s) / ED Diagnoses ?Final diagnoses:  ?Encounter for screening for HIV  ? ? ?Rx / DC Orders ?ED Discharge Orders   ? ? None  ? ?  ? ? ?  ?Cheryll Cockayne, MD ?03/15/21 [phone] ? ?

## 2021-03-15 NOTE — Discharge Instructions (Signed)
Follow-up on your MyChart for your test results. ? ?Do not have any sex until your results have returned. ? ?Return back to the ER if you have fevers vomiting pain or any additional concerns.  Otherwise follow-up with your doctor to schedule appointment within the next week. ?

## 2021-03-15 NOTE — ED Triage Notes (Addendum)
Pt requesting to be STD checked. States that she had intercourse on valentines day and her partner messaged her this morning stating that they tested positive for HIV. Pt denies sx's. Reports female partner, no use of protection. ?

## 2023-02-07 ENCOUNTER — Encounter (HOSPITAL_BASED_OUTPATIENT_CLINIC_OR_DEPARTMENT_OTHER): Payer: Self-pay | Admitting: Emergency Medicine

## 2023-02-07 ENCOUNTER — Other Ambulatory Visit: Payer: Self-pay

## 2023-02-07 ENCOUNTER — Emergency Department (HOSPITAL_BASED_OUTPATIENT_CLINIC_OR_DEPARTMENT_OTHER)
Admission: EM | Admit: 2023-02-07 | Discharge: 2023-02-07 | Disposition: A | Discharge status: Home / Self Care | Payer: No Typology Code available for payment source | Attending: Emergency Medicine | Admitting: Emergency Medicine

## 2023-02-07 DIAGNOSIS — R112 Nausea with vomiting, unspecified: Secondary | ICD-10-CM | POA: Diagnosis present

## 2023-02-07 DIAGNOSIS — R197 Diarrhea, unspecified: Secondary | ICD-10-CM | POA: Insufficient documentation

## 2023-02-07 DIAGNOSIS — N179 Acute kidney failure, unspecified: Secondary | ICD-10-CM | POA: Diagnosis not present

## 2023-02-07 DIAGNOSIS — E876 Hypokalemia: Secondary | ICD-10-CM | POA: Diagnosis not present

## 2023-02-07 LAB — BASIC METABOLIC PANEL
Anion gap: 13 (ref 5–15)
BUN: 33 mg/dL — ABNORMAL HIGH (ref 6–20)
CO2: 22 mmol/L (ref 22–32)
Calcium: 9.1 mg/dL (ref 8.9–10.3)
Chloride: 101 mmol/L (ref 98–111)
Creatinine, Ser: 1.69 mg/dL — ABNORMAL HIGH (ref 0.44–1.00)
GFR, Estimated: 43 mL/min — ABNORMAL LOW (ref >60)
Glucose, Bld: 85 mg/dL (ref 70–99)
Potassium: 3.1 mmol/L — ABNORMAL LOW (ref 3.5–5.1)
Sodium: 136 mmol/L (ref 135–145)

## 2023-02-07 LAB — CBC
HCT: 44.4 % (ref 36.0–46.0)
Hemoglobin: 15.4 g/dL — ABNORMAL HIGH (ref 12.0–15.0)
MCH: 28.9 pg (ref 26.0–34.0)
MCHC: 34.7 g/dL (ref 30.0–36.0)
MCV: 83.5 fL (ref 80.0–100.0)
Platelets: 424 10*3/uL — ABNORMAL HIGH (ref 150–400)
RBC: 5.32 MIL/uL — ABNORMAL HIGH (ref 3.87–5.11)
RDW: 13.7 % (ref 11.5–15.5)
WBC: 6.2 10*3/uL (ref 4.0–10.5)
nRBC: 0 % (ref 0.0–0.2)

## 2023-02-07 LAB — COMPREHENSIVE METABOLIC PANEL
ALT: 14 U/L (ref 0–44)
AST: 18 U/L (ref 15–41)
Albumin: 5.4 g/dL — ABNORMAL HIGH (ref 3.5–5.0)
Alkaline Phosphatase: 52 U/L (ref 38–126)
Anion gap: 16 — ABNORMAL HIGH (ref 5–15)
BUN: 39 mg/dL — ABNORMAL HIGH (ref 6–20)
CO2: 20 mmol/L — ABNORMAL LOW (ref 22–32)
Calcium: 9.8 mg/dL (ref 8.9–10.3)
Chloride: 96 mmol/L — ABNORMAL LOW (ref 98–111)
Creatinine, Ser: 2.48 mg/dL — ABNORMAL HIGH (ref 0.44–1.00)
GFR, Estimated: 27 mL/min — ABNORMAL LOW (ref >60)
Glucose, Bld: 92 mg/dL (ref 70–99)
Potassium: 3.8 mmol/L (ref 3.5–5.1)
Sodium: 132 mmol/L — ABNORMAL LOW (ref 135–145)
Total Bilirubin: 0.4 mg/dL (ref 0.0–1.2)
Total Protein: 9.4 g/dL — ABNORMAL HIGH (ref 6.5–8.1)

## 2023-02-07 LAB — URINALYSIS, ROUTINE W REFLEX MICROSCOPIC
Bilirubin Urine: NEGATIVE
Glucose, UA: NEGATIVE mg/dL
Ketones, ur: NEGATIVE mg/dL
Leukocytes,Ua: NEGATIVE
Nitrite: NEGATIVE
Specific Gravity, Urine: 1.007 (ref 1.005–1.030)
pH: 5.5 (ref 5.0–8.0)

## 2023-02-07 LAB — CK: Total CK: 81 U/L (ref 38–234)

## 2023-02-07 LAB — MAGNESIUM: Magnesium: 2.3 mg/dL (ref 1.7–2.4)

## 2023-02-07 LAB — LIPASE, BLOOD: Lipase: 14 U/L (ref 11–51)

## 2023-02-07 LAB — PREGNANCY, URINE: Preg Test, Ur: NEGATIVE

## 2023-02-07 MED ORDER — LACTATED RINGERS IV BOLUS
1000.0000 mL | Freq: Once | INTRAVENOUS | Status: AC
  Administered 2023-02-07: 1000 mL via INTRAVENOUS

## 2023-02-07 MED ORDER — POTASSIUM CHLORIDE CRYS ER 20 MEQ PO TBCR: 20.0000 meq | EXTENDED_RELEASE_TABLET | Freq: Two times a day (BID) | ORAL | 0 refills | Status: AC

## 2023-02-07 MED ORDER — ONDANSETRON HCL 4 MG/2ML IJ SOLN
4.0000 mg | Freq: Once | INTRAMUSCULAR | Status: AC
  Administered 2023-02-07: 4 mg via INTRAVENOUS
  Filled 2023-02-07: qty 2

## 2023-02-07 NOTE — ED Provider Notes (Signed)
[place] [facility] DEPARTMENT AT [facility] Provider Note   CSN: [id number] Arrival date & time: 02/07/23  1311     History  Chief Complaint  Patient presents with   Emesis    Julie Randall is a 23 y.o. female.  Patient with no history of abdominal surgeries presents to the emergency department for evaluation of nausea, vomiting, and diarrhea starting yesterday morning.  Patient has had multiple episodes of nonbilious vomiting and watery, nonbloody stool that continues today.  No associated abdominal pain.  She has been unable to keep down oral medications, or eat and drink anything.  She is urinating less, if at all today.  She denies recent suspicious food or water exposure.  She denies travel in the past week.  She denies alcohol or THC use.       Home Medications Prior to Admission medications   Medication Sig Start Date End Date Taking? Authorizing Provider  ibuprofen (ADVIL) 600 MG tablet Take 1 tablet (600 mg total) by mouth every 6 (six) hours. 04/15/20   Meisinger, Tawanna Cooler, MD  metroNIDAZOLE (FLAGYL) 500 MG tablet Take 1 tablet (500 mg total) by mouth 2 (two) times daily. 12/11/20   Gloris Manchester, MD  potassium chloride SA (KLOR-CON) 20 MEQ tablet Take 2 tablets (40 mEq total) by mouth daily. 11/06/18   Molpus, John, MD  Prenatal Vit-Fe Fumarate-FA (PRENATAL PO) Take 1 tablet by mouth daily.    [provider]      Allergies    Patient has no known allergies.    Review of Systems   Review of Systems  Physical Exam Updated Vital Signs BP (!) 129/110   Pulse 72   Temp 98.3 F (36.8 C) (Oral)   Resp 18   Ht 5\' 6"  (1.676 m)   Wt 57.2 kg   LMP 11/23/2021 (Exact Date)   SpO2 100%   BMI 20.34 kg/m  Physical Exam Vitals and nursing note reviewed.  Constitutional:      General: She is not in acute distress.    Appearance: She is well-developed.  HENT:     Head: Normocephalic and atraumatic.     Right Ear: External ear normal.     Left Ear:  External ear normal.     Nose: Nose normal.     Mouth/Throat:     Mouth: Mucous membranes are dry.  Eyes:     Conjunctiva/sclera: Conjunctivae normal.  Cardiovascular:     Rate and Rhythm: Normal rate and regular rhythm.     Heart sounds: No murmur heard. Pulmonary:     Effort: No respiratory distress.     Breath sounds: No wheezing, rhonchi or rales.  Abdominal:     Palpations: Abdomen is soft.     Tenderness: There is no abdominal tenderness. There is no guarding or rebound.     Comments: No abdominal tenderness to palpation  Musculoskeletal:     Cervical back: Normal range of motion and neck supple.     Right lower leg: No edema.     Left lower leg: No edema.  Skin:    General: Skin is warm and dry.     Findings: No rash.  Neurological:     General: No focal deficit present.     Mental Status: She is alert. Mental status is at baseline.     Motor: No weakness.  Psychiatric:        Mood and Affect: Mood normal.     ED Results / Procedures /  Treatments   Labs (all labs ordered are listed, but only abnormal results are displayed) Labs Reviewed  COMPREHENSIVE METABOLIC PANEL - Abnormal; Notable for the following components:      Result Value   Sodium 132 (*)    Chloride 96 (*)    CO2 20 (*)    BUN 39 (*)    Creatinine, Ser 2.48 (*)    Total Protein 9.4 (*)    Albumin 5.4 (*)    GFR, Estimated 27 (*)    Anion gap 16 (*)    All other components within normal limits  CBC - Abnormal; Notable for the following components:   RBC 5.32 (*)    Hemoglobin 15.4 (*)    Platelets 424 (*)    All other components within normal limits  URINALYSIS, ROUTINE W REFLEX MICROSCOPIC - Abnormal; Notable for the following components:   Color, Urine COLORLESS (*)    Hgb urine dipstick SMALL (*)    Protein, ur TRACE (*)    Bacteria, UA RARE (*)    All other components within normal limits  BASIC METABOLIC PANEL - Abnormal; Notable for the following components:   Potassium 3.1 (*)     BUN 33 (*)    Creatinine, Ser 1.69 (*)    GFR, Estimated 43 (*)    All other components within normal limits  LIPASE, BLOOD  PREGNANCY, URINE  MAGNESIUM  CK    EKG None  Radiology No results found.  Procedures Procedures    Medications Ordered in ED Medications  lactated ringers bolus 1,000 mL (has no administration in time range)  lactated ringers bolus 1,000 mL (has no administration in time range)  ondansetron (ZOFRAN) injection 4 mg (has no administration in time range)    ED Course/ Medical Decision Making/ A&P    Patient seen and examined. History obtained directly from patient. Work-up including labs, imaging, EKG ordered in triage, if performed, were reviewed.    Labs/EKG: Independently reviewed and interpreted.  This included: CBC with normal white blood cell count, concentrated hemoglobin likely due to dehydration; CMP AKI noted with creatinine 2.48 with a BUN of 39, bicarb is 20 with an anion gap of 16, normal glucose, sodium and chloride slightly low at 132 and 96 respectively; lipase is normal.  UA and pregnancy are pending.  Added on magnesium.  Imaging: None ordered  Medications/Fluids: Ordered: 2 L lactated Ringer's, IV Zofran 4 mg  Most recent vital signs reviewed and are as follows: BP (!) 129/110   Pulse 72   Temp 98.3 F (36.8 C) (Oral)   Resp 18   Ht 5\' 6"  (1.676 m)   Wt 57.2 kg   LMP 11/23/2021 (Exact Date)   SpO2 100%   BMI 20.34 kg/m   Initial impression: Nausea vomiting and diarrhea, AKI  7:56 PM Reassessment performed. Patient appears improved.  Patient has been drinking water and had some soup without vomiting.  She has received 2 L IV fluids.  BMP was rechecked.  Abdomen remains nontender.  Labs personally reviewed and interpreted including: Creatinine 2.48 >> 1.69; BUN 39 >> 33; Potassium 3.8 >> 3.1; CK was normal; UA with a few white cells, some red cells as well, symptoms not clinically consistent with pyelonephritis.  Reviewed  pertinent lab work and imaging with patient at bedside. Questions answered.   Most current vital signs reviewed and are as follows: BP (!) 142/87   Pulse 72   Temp 98.2 F (36.8 C) (Oral)   Resp 16  Ht 5\' 6"  (1.676 m)   Wt 57.2 kg   LMP 11/23/2021 (Exact Date)   SpO2 100%   BMI 20.34 kg/m   Plan: Discharge to home.  Patient is comfortable with this plan.  She is clinically improved and now tolerating orals in the ED.  Lab work shows good improvement after IV hydration and oral hydration.  She does not have other significant comorbidities.  Prescriptions written for: Potassium supplementation, patient states that she was already prescribed Zofran and has this at home  Other home care instructions discussed: Clear liquid diet for 12-24 hours and then slow advancement to brat diet  ED return instructions discussed: The patient was urged to return to the Emergency Department immediately with worsening of current symptoms, worsening abdominal pain, persistent vomiting, blood noted in stools, fever, or any other concerns. The patient verbalized understanding.   Follow-up instructions discussed: Patient encouraged to follow-up with their PCP in 2-3 days for recheck of symptoms and recheck of kidney function to ensure that this goes back to her baseline.                                 Medical Decision Making Amount and/or Complexity of Data Reviewed Labs: ordered.  Risk Prescription drug management.   Patient with nausea, vomiting, and diarrhea for the past couple of days.  She does have significant dehydration characterized by AKI.  Vitals are stable, no fever.  No signs of dehydration, tolerating PO's. Lungs are clear. No focal abdominal pain. Low concern for appendicitis, cholecystitis, pancreatitis, ruptured viscus, UTI, kidney stone, aortic dissection, aortic aneurysm or other emergent abdominal etiology.  Consider admission due to AKI however she was given treatment in the ED with  good progress.  Given this, feel comfortable with discharged home as long as she is willing to return if symptoms worsen.  She seems reliable.    The patient's vital signs, pertinent lab work and imaging were reviewed and interpreted as discussed in the ED course. Hospitalization was considered for further testing, treatments, or serial exams/observation. However as patient is well-appearing, has a stable exam, and reassuring studies today, I do not feel that they warrant admission at this time. This plan was discussed with the patient who verbalizes agreement and comfort with this plan and seems reliable and able to return to the Emergency Department with worsening or changing symptoms.          Final Clinical Impression(s) / ED Diagnoses Final diagnoses:  Nausea vomiting and diarrhea  Acute kidney injury (HCC)  Hypokalemia    Rx / DC Orders ED Discharge Orders          Ordered    potassium chloride SA (KLOR-CON M) 20 MEQ tablet  2 times daily        02/07/23 1951              Renne Crigler, Cordelia Poche 02/07/23 1959    Glyn Ade, MD 02/07/23 2121

## 2023-02-07 NOTE — ED Notes (Signed)
Pt is sipping on water and has had a few bites of chicken  noodle soup,still trying to eat more.

## 2023-02-07 NOTE — ED Triage Notes (Signed)
Pt via pov from home with nausea and vomiting since yesterday. Pt reports that she has not been able to keep water down, but has been able to keep popsicles down. Pt reports that she is having muscle cramps in her hands and feet since last night. Pt alert & oriented, nad noted.

## 2023-02-07 NOTE — Discharge Instructions (Signed)
Please read and follow all provided instructions.  Your diagnoses today include:  1. Nausea vomiting and diarrhea   2. Acute kidney injury (HCC)   3. Hypokalemia     TTests performed today include: Blood cell counts and platelets: Showed signs of dehydration, no severe infection Kidney and liver function tests: Your kidney function was pretty weak, improved with IV fluids, potassium was a little low on recheck Pancreas function test (called lipase) Creatine kinase: Test for muscle breakdown was normal Urine test to look for infection: No definite signs of infection A blood or urine test for pregnancy (women only) Vital signs. See below for your results today.   Medications prescribed:  Potassium supplementation  Take any prescribed medications only as directed.  Home care instructions:  Follow any educational materials contained in this packet.  Your abdominal pain, nausea, vomiting, and diarrhea may be caused by a viral gastroenteritis also called 'stomach flu'. You should rest for the next several days. Keep drinking plenty of fluids and use the medicine for nausea as directed.   Drink clear liquids for the next 24 hours and introduce solid foods slowly after 24 hours using the b.r.a.t. diet (Bananas, Rice, Applesauce, Toast, Yogurt).    Follow-up instructions: Please follow-up with your primary care provider in the next 2-3 days for further evaluation of your symptoms. If you are not feeling better in 48 hours you may have a condition that is more serious and you need re-evaluation.  You also need to have your kidney function rechecked to ensure that it goes back down to normal range for you.  Return instructions:  SEEK IMMEDIATE MEDICAL ATTENTION IF: If you have pain that does not go away or becomes severe  A temperature above 101F develops  Repeated vomiting occurs (multiple episodes)  If you have pain that becomes localized to portions of the abdomen. The right side could  possibly be appendicitis. In an adult, the left lower portion of the abdomen could be colitis or diverticulitis.  Blood is being passed in stools or vomit (bright red or black tarry stools)  You develop chest pain, difficulty breathing, dizziness or fainting, or become confused, poorly responsive, or inconsolable (young children) If you have any other emergent concerns regarding your health  Additional Information: Abdominal (belly) pain can be caused by many things. Your caregiver performed an examination and possibly ordered blood/urine tests and imaging (CT scan, x-rays, ultrasound). Many cases can be observed and treated at home after initial evaluation in the emergency department. Even though you are being discharged home, abdominal pain can be unpredictable. Therefore, you need a repeated exam if your pain does not resolve, returns, or worsens. Most patients with abdominal pain don't have to be admitted to the hospital or have surgery, but serious problems like appendicitis and gallbladder attacks can start out as nonspecific pain. Many abdominal conditions cannot be diagnosed in one visit, so follow-up evaluations are very important.  Your vital signs today were: BP (!) 142/87   Pulse 72   Temp 98.2 F (36.8 C) (Oral)   Resp 16   Ht 5\' 6"  (1.676 m)   Wt 57.2 kg   LMP 11/23/2021 (Exact Date)   SpO2 100%   BMI 20.34 kg/m  If your blood pressure (bp) was elevated above 135/85 this visit, please have this repeated by your doctor within one month. --------------

## 2023-12-05 ENCOUNTER — Encounter (HOSPITAL_COMMUNITY): Payer: Self-pay | Admitting: *Deleted

## 2023-12-05 ENCOUNTER — Emergency Department (HOSPITAL_COMMUNITY)
Admission: EM | Admit: 2023-12-05 | Discharge: 2023-12-06 | Discharge status: Against Medical Advice | Attending: Emergency Medicine | Admitting: Emergency Medicine

## 2023-12-05 ENCOUNTER — Other Ambulatory Visit: Payer: Self-pay

## 2023-12-05 DIAGNOSIS — M542 Cervicalgia: Secondary | ICD-10-CM | POA: Diagnosis not present

## 2023-12-05 DIAGNOSIS — Y92410 Unspecified street and highway as the place of occurrence of the external cause: Secondary | ICD-10-CM | POA: Diagnosis not present

## 2023-12-05 DIAGNOSIS — Z5321 Procedure and treatment not carried out due to patient leaving prior to being seen by health care provider: Secondary | ICD-10-CM | POA: Insufficient documentation

## 2023-12-05 DIAGNOSIS — R519 Headache, unspecified: Secondary | ICD-10-CM | POA: Diagnosis not present

## 2023-12-05 DIAGNOSIS — S0081XA Abrasion of other part of head, initial encounter: Secondary | ICD-10-CM | POA: Diagnosis present

## 2023-12-05 DIAGNOSIS — S00211A Abrasion of right eyelid and periocular area, initial encounter: Secondary | ICD-10-CM | POA: Diagnosis present

## 2023-12-05 MED ORDER — ACETAMINOPHEN 325 MG PO TABS
650.0000 mg | ORAL_TABLET | Freq: Once | ORAL | Status: AC
Start: 2023-12-06 — End: 2023-12-05
  Administered 2023-12-05: 650 mg via ORAL
  Filled 2023-12-05: qty 2
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
## 2023-12-06 ENCOUNTER — Emergency Department (HOSPITAL_COMMUNITY)

## 2023-12-06 ENCOUNTER — Emergency Department (HOSPITAL_COMMUNITY)
Admission: EM | Admit: 2023-12-06 | Discharge: 2023-12-06 | Disposition: A | Discharge status: Home / Self Care | Source: Home / Self Care | Attending: Emergency Medicine | Admitting: Emergency Medicine

## 2023-12-06 DIAGNOSIS — Y92410 Unspecified street and highway as the place of occurrence of the external cause: Secondary | ICD-10-CM | POA: Insufficient documentation

## 2023-12-06 DIAGNOSIS — M542 Cervicalgia: Secondary | ICD-10-CM | POA: Insufficient documentation

## 2023-12-06 DIAGNOSIS — R519 Headache, unspecified: Secondary | ICD-10-CM | POA: Insufficient documentation

## 2023-12-06 DIAGNOSIS — S00211A Abrasion of right eyelid and periocular area, initial encounter: Secondary | ICD-10-CM | POA: Insufficient documentation

## 2023-12-06 LAB — CBC
HCT: 38.9 % (ref 36.0–46.0)
Hemoglobin: 13.1 g/dL (ref 12.0–15.0)
MCH: 29.3 pg (ref 26.0–34.0)
MCHC: 33.7 g/dL (ref 30.0–36.0)
MCV: 87 fL (ref 80.0–100.0)
Platelets: 349 K/uL (ref 150–400)
RBC: 4.47 MIL/uL (ref 3.87–5.11)
RDW: 14.1 % (ref 11.5–15.5)
WBC: 6.2 K/uL (ref 4.0–10.5)
nRBC: 0 % (ref 0.0–0.2)

## 2023-12-06 LAB — BASIC METABOLIC PANEL WITH GFR
Anion gap: 12 (ref 5–15)
BUN: 10 mg/dL (ref 6–20)
CO2: 23 mmol/L (ref 22–32)
Calcium: 8.9 mg/dL (ref 8.9–10.3)
Chloride: 109 mmol/L (ref 98–111)
Creatinine, Ser: 0.7 mg/dL (ref 0.44–1.00)
GFR, Estimated: >60 mL/min (ref >60)
Glucose, Bld: 101 mg/dL — ABNORMAL HIGH (ref 70–99)
Potassium: 4.3 mmol/L (ref 3.5–5.1)
Sodium: 144 mmol/L (ref 135–145)

## 2023-12-06 LAB — HCG, SERUM, QUALITATIVE: Preg, Serum: NEGATIVE

## 2023-12-06 LAB — ETHANOL: Alcohol, Ethyl (B): 207 mg/dL — ABNORMAL HIGH (ref <15)
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
## 2023-12-06 NOTE — ED Triage Notes (Signed)
 Pt to window stating that she needs to see someone right now because she was about to AMS. This nurse asked her what that meant and she reply's, you work here so you should know, I let her know that I really didn't and she told someone on the phone we were acting like she was stupid. When she walked around the corner the nurse tech ask if she meant AMA and she replied she knew what the fuck I meant., and walked away. Unsure where she walked off to.
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
## 2023-12-06 NOTE — ED Notes (Signed)
 [facility] called to have pt DC because pt is there to be seen

## 2023-12-07 ENCOUNTER — Other Ambulatory Visit: Payer: Self-pay

## 2023-12-07 ENCOUNTER — Encounter (HOSPITAL_BASED_OUTPATIENT_CLINIC_OR_DEPARTMENT_OTHER): Payer: Self-pay | Admitting: Emergency Medicine

## 2023-12-07 ENCOUNTER — Emergency Department (HOSPITAL_BASED_OUTPATIENT_CLINIC_OR_DEPARTMENT_OTHER)
Admission: EM | Admit: 2023-12-07 | Discharge: 2023-12-07 | Disposition: A | Discharge status: Home / Self Care | Attending: Emergency Medicine | Admitting: Emergency Medicine

## 2023-12-07 ENCOUNTER — Emergency Department (HOSPITAL_BASED_OUTPATIENT_CLINIC_OR_DEPARTMENT_OTHER)

## 2023-12-07 DIAGNOSIS — M542 Cervicalgia: Secondary | ICD-10-CM | POA: Insufficient documentation

## 2023-12-07 DIAGNOSIS — Y92410 Unspecified street and highway as the place of occurrence of the external cause: Secondary | ICD-10-CM | POA: Diagnosis not present

## 2023-12-07 DIAGNOSIS — M545 Low back pain, unspecified: Secondary | ICD-10-CM | POA: Insufficient documentation

## 2023-12-07 MED ORDER — DEXAMETHASONE SOD PHOSPHATE PF 10 MG/ML IJ SOLN
10.0000 mg | Freq: Once | INTRAMUSCULAR | Status: AC
  Administered 2023-12-07: 10 mg via INTRAMUSCULAR

## 2023-12-07 MED ORDER — IBUPROFEN 800 MG PO TABS
800.0000 mg | ORAL_TABLET | Freq: Once | ORAL | Status: AC
  Administered 2023-12-07: 800 mg via ORAL
  Filled 2023-12-07: qty 1

## 2023-12-07 MED ORDER — METHOCARBAMOL 500 MG PO TABS: 500.0000 mg | ORAL_TABLET | Freq: Two times a day (BID) | ORAL | 0 refills | Status: AC
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
## 2023-12-07 NOTE — ED Notes (Signed)
 Patient transported to CT
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
